# Patient Record
Sex: Female | Born: 1950
Health system: Southern US, Community
[De-identification: ages and names within clinical notes are randomized; demographics above are authoritative.]

## PROBLEM LIST (undated history)

## (undated) DIAGNOSIS — N281 Cyst of kidney, acquired: Secondary | ICD-10-CM

## (undated) DIAGNOSIS — Z860100 Personal history of colon polyps, unspecified: Secondary | ICD-10-CM

## (undated) DIAGNOSIS — E669 Obesity, unspecified: Secondary | ICD-10-CM

## (undated) DIAGNOSIS — R112 Nausea with vomiting, unspecified: Secondary | ICD-10-CM

## (undated) DIAGNOSIS — R011 Cardiac murmur, unspecified: Secondary | ICD-10-CM

## (undated) DIAGNOSIS — J939 Pneumothorax, unspecified: Secondary | ICD-10-CM

## (undated) DIAGNOSIS — I517 Cardiomegaly: Secondary | ICD-10-CM

## (undated) DIAGNOSIS — Z9889 Other specified postprocedural states: Secondary | ICD-10-CM

## (undated) DIAGNOSIS — I519 Heart disease, unspecified: Secondary | ICD-10-CM

## (undated) DIAGNOSIS — K5790 Diverticulosis of intestine, part unspecified, without perforation or abscess without bleeding: Secondary | ICD-10-CM

## (undated) DIAGNOSIS — Z8679 Personal history of other diseases of the circulatory system: Secondary | ICD-10-CM

## (undated) DIAGNOSIS — C3490 Malignant neoplasm of unspecified part of unspecified bronchus or lung: Secondary | ICD-10-CM

## (undated) DIAGNOSIS — Z972 Presence of dental prosthetic device (complete) (partial): Secondary | ICD-10-CM

## (undated) DIAGNOSIS — C541 Malignant neoplasm of endometrium: Secondary | ICD-10-CM

## (undated) DIAGNOSIS — I358 Other nonrheumatic aortic valve disorders: Secondary | ICD-10-CM

## (undated) DIAGNOSIS — Z973 Presence of spectacles and contact lenses: Secondary | ICD-10-CM

## (undated) DIAGNOSIS — M479 Spondylosis, unspecified: Secondary | ICD-10-CM

## (undated) DIAGNOSIS — I4510 Unspecified right bundle-branch block: Secondary | ICD-10-CM

## (undated) DIAGNOSIS — I700 Atherosclerosis of aorta: Secondary | ICD-10-CM

## (undated) DIAGNOSIS — I10 Essential (primary) hypertension: Secondary | ICD-10-CM

## (undated) DIAGNOSIS — R918 Other nonspecific abnormal finding of lung field: Secondary | ICD-10-CM

## (undated) DIAGNOSIS — K648 Other hemorrhoids: Secondary | ICD-10-CM

## (undated) HISTORY — DX: Malignant neoplasm of endometrium: C54.1

## (undated) HISTORY — PX: OTHER SURGICAL HISTORY: SHX169

## (undated) HISTORY — DX: Obesity, unspecified: E66.9

## (undated) HISTORY — PX: CHOLECYSTECTOMY: SHX55

## (undated) HISTORY — PX: REDUCTION MAMMAPLASTY: SUR839

## (undated) HISTORY — PX: BREAST SURGERY: SHX581

## (undated) HISTORY — PX: TONSILLECTOMY: SUR1361

---

## 1996-10-12 HISTORY — PX: BILATERAL OOPHORECTOMY: SHX1221

## 1998-10-02 ENCOUNTER — Other Ambulatory Visit: Admission: RE | Admit: 1998-10-02 | Discharge: 1998-10-02 | Payer: Self-pay | Admitting: *Deleted

## 1999-08-18 ENCOUNTER — Encounter: Admission: RE | Admit: 1999-08-18 | Discharge: 1999-08-18 | Payer: Self-pay | Admitting: Obstetrics and Gynecology

## 1999-08-18 ENCOUNTER — Encounter: Payer: Self-pay | Admitting: Obstetrics and Gynecology

## 1999-10-08 ENCOUNTER — Other Ambulatory Visit: Admission: RE | Admit: 1999-10-08 | Discharge: 1999-10-08 | Payer: Self-pay | Admitting: Obstetrics and Gynecology

## 2000-08-20 ENCOUNTER — Encounter: Admission: RE | Admit: 2000-08-20 | Discharge: 2000-08-20 | Payer: Self-pay | Admitting: Obstetrics and Gynecology

## 2000-08-20 ENCOUNTER — Encounter: Payer: Self-pay | Admitting: Obstetrics and Gynecology

## 2000-10-13 ENCOUNTER — Other Ambulatory Visit: Admission: RE | Admit: 2000-10-13 | Discharge: 2000-10-13 | Payer: Self-pay | Admitting: Obstetrics and Gynecology

## 2001-08-23 ENCOUNTER — Encounter: Admission: RE | Admit: 2001-08-23 | Discharge: 2001-08-23 | Payer: Self-pay | Admitting: Obstetrics and Gynecology

## 2001-08-23 ENCOUNTER — Encounter: Payer: Self-pay | Admitting: Obstetrics and Gynecology

## 2001-10-12 DIAGNOSIS — Z8679 Personal history of other diseases of the circulatory system: Secondary | ICD-10-CM

## 2001-10-12 HISTORY — DX: Personal history of other diseases of the circulatory system: Z86.79

## 2002-04-10 ENCOUNTER — Other Ambulatory Visit: Admission: RE | Admit: 2002-04-10 | Discharge: 2002-04-10 | Payer: Self-pay | Admitting: Obstetrics and Gynecology

## 2002-04-11 HISTORY — PX: ABDOMINAL HYSTERECTOMY: SHX81

## 2002-04-25 ENCOUNTER — Ambulatory Visit (HOSPITAL_COMMUNITY): Admission: RE | Admit: 2002-04-25 | Discharge: 2002-04-25 | Payer: Self-pay | Admitting: Obstetrics and Gynecology

## 2002-04-25 ENCOUNTER — Encounter: Payer: Self-pay | Admitting: Obstetrics and Gynecology

## 2002-05-05 ENCOUNTER — Encounter: Payer: Self-pay | Admitting: Obstetrics and Gynecology

## 2002-05-09 ENCOUNTER — Inpatient Hospital Stay (HOSPITAL_COMMUNITY): Admission: RE | Admit: 2002-05-09 | Discharge: 2002-05-11 | Payer: Self-pay | Admitting: Obstetrics and Gynecology

## 2002-05-09 ENCOUNTER — Encounter (INDEPENDENT_AMBULATORY_CARE_PROVIDER_SITE_OTHER): Payer: Self-pay

## 2002-05-09 ENCOUNTER — Encounter (INDEPENDENT_AMBULATORY_CARE_PROVIDER_SITE_OTHER): Payer: Self-pay | Admitting: Specialist

## 2002-09-04 ENCOUNTER — Encounter: Payer: Self-pay | Admitting: Obstetrics and Gynecology

## 2002-09-04 ENCOUNTER — Other Ambulatory Visit: Admission: RE | Admit: 2002-09-04 | Discharge: 2002-09-04 | Payer: Self-pay | Admitting: Obstetrics and Gynecology

## 2002-09-04 ENCOUNTER — Encounter: Admission: RE | Admit: 2002-09-04 | Discharge: 2002-09-04 | Payer: Self-pay | Admitting: Obstetrics and Gynecology

## 2002-12-05 ENCOUNTER — Other Ambulatory Visit: Admission: RE | Admit: 2002-12-05 | Discharge: 2002-12-05 | Payer: Self-pay | Admitting: Obstetrics and Gynecology

## 2003-03-08 ENCOUNTER — Other Ambulatory Visit: Admission: RE | Admit: 2003-03-08 | Discharge: 2003-03-08 | Payer: Self-pay | Admitting: Obstetrics and Gynecology

## 2003-06-08 ENCOUNTER — Other Ambulatory Visit: Admission: RE | Admit: 2003-06-08 | Discharge: 2003-06-08 | Payer: Self-pay | Admitting: Obstetrics and Gynecology

## 2003-08-03 ENCOUNTER — Encounter: Payer: Self-pay | Admitting: Obstetrics and Gynecology

## 2003-08-03 ENCOUNTER — Encounter: Admission: RE | Admit: 2003-08-03 | Discharge: 2003-08-03 | Payer: Self-pay | Admitting: Obstetrics and Gynecology

## 2003-09-11 ENCOUNTER — Encounter: Admission: RE | Admit: 2003-09-11 | Discharge: 2003-09-11 | Payer: Self-pay | Admitting: Obstetrics and Gynecology

## 2003-10-13 DIAGNOSIS — J939 Pneumothorax, unspecified: Secondary | ICD-10-CM

## 2003-10-13 HISTORY — DX: Pneumothorax, unspecified: J93.9

## 2003-12-10 ENCOUNTER — Other Ambulatory Visit: Admission: RE | Admit: 2003-12-10 | Discharge: 2003-12-10 | Payer: Self-pay | Admitting: Obstetrics and Gynecology

## 2004-07-18 ENCOUNTER — Encounter: Admission: RE | Admit: 2004-07-18 | Discharge: 2004-07-18 | Payer: Self-pay | Admitting: Obstetrics and Gynecology

## 2004-07-24 ENCOUNTER — Encounter: Admission: RE | Admit: 2004-07-24 | Discharge: 2004-07-24 | Payer: Self-pay | Admitting: Obstetrics and Gynecology

## 2004-08-12 ENCOUNTER — Encounter (INDEPENDENT_AMBULATORY_CARE_PROVIDER_SITE_OTHER): Payer: Self-pay | Admitting: *Deleted

## 2004-08-12 ENCOUNTER — Inpatient Hospital Stay (HOSPITAL_COMMUNITY): Admission: RE | Admit: 2004-08-12 | Discharge: 2004-08-16 | Payer: Self-pay | Admitting: Thoracic Surgery

## 2004-08-21 ENCOUNTER — Encounter: Admission: RE | Admit: 2004-08-21 | Discharge: 2004-08-21 | Payer: Self-pay | Admitting: Thoracic Surgery

## 2004-09-11 ENCOUNTER — Encounter: Admission: RE | Admit: 2004-09-11 | Discharge: 2004-09-11 | Payer: Self-pay | Admitting: Thoracic Surgery

## 2004-09-11 ENCOUNTER — Encounter: Admission: RE | Admit: 2004-09-11 | Discharge: 2004-09-11 | Payer: Self-pay | Admitting: Obstetrics and Gynecology

## 2004-11-11 ENCOUNTER — Encounter: Admission: RE | Admit: 2004-11-11 | Discharge: 2004-11-11 | Payer: Self-pay | Admitting: Thoracic Surgery

## 2005-01-27 ENCOUNTER — Ambulatory Visit: Admission: RE | Admit: 2005-01-27 | Discharge: 2005-01-27 | Payer: Self-pay | Admitting: Gynecology

## 2005-01-27 ENCOUNTER — Encounter (INDEPENDENT_AMBULATORY_CARE_PROVIDER_SITE_OTHER): Payer: Self-pay | Admitting: *Deleted

## 2005-02-16 ENCOUNTER — Ambulatory Visit: Admission: RE | Admit: 2005-02-16 | Discharge: 2005-04-27 | Payer: Self-pay | Admitting: Radiation Oncology

## 2005-02-17 ENCOUNTER — Ambulatory Visit (HOSPITAL_COMMUNITY): Admission: RE | Admit: 2005-02-17 | Discharge: 2005-02-17 | Payer: Self-pay | Admitting: Radiation Oncology

## 2005-02-24 ENCOUNTER — Ambulatory Visit (HOSPITAL_COMMUNITY): Admission: RE | Admit: 2005-02-24 | Discharge: 2005-02-24 | Payer: Self-pay | Admitting: Radiation Oncology

## 2005-03-03 ENCOUNTER — Ambulatory Visit (HOSPITAL_COMMUNITY): Admission: RE | Admit: 2005-03-03 | Discharge: 2005-03-03 | Payer: Self-pay | Admitting: Radiation Oncology

## 2005-03-05 ENCOUNTER — Ambulatory Visit (HOSPITAL_COMMUNITY): Admission: RE | Admit: 2005-03-05 | Discharge: 2005-03-05 | Payer: Self-pay | Admitting: Radiation Oncology

## 2005-03-17 ENCOUNTER — Ambulatory Visit (HOSPITAL_COMMUNITY): Admission: RE | Admit: 2005-03-17 | Discharge: 2005-03-17 | Payer: Self-pay | Admitting: Radiation Oncology

## 2005-07-01 ENCOUNTER — Encounter (INDEPENDENT_AMBULATORY_CARE_PROVIDER_SITE_OTHER): Payer: Self-pay | Admitting: *Deleted

## 2005-07-01 ENCOUNTER — Ambulatory Visit: Admission: RE | Admit: 2005-07-01 | Discharge: 2005-07-01 | Payer: Self-pay | Admitting: Gynecology

## 2005-07-03 ENCOUNTER — Other Ambulatory Visit: Admission: RE | Admit: 2005-07-03 | Discharge: 2005-07-03 | Payer: Self-pay | Admitting: Gynecology

## 2005-09-04 ENCOUNTER — Ambulatory Visit: Admission: RE | Admit: 2005-09-04 | Discharge: 2005-10-02 | Payer: Self-pay | Admitting: Radiation Oncology

## 2005-09-08 ENCOUNTER — Other Ambulatory Visit: Admission: RE | Admit: 2005-09-08 | Discharge: 2005-09-08 | Payer: Self-pay | Admitting: Radiation Oncology

## 2005-10-01 ENCOUNTER — Encounter: Admission: RE | Admit: 2005-10-01 | Discharge: 2005-10-01 | Payer: Self-pay | Admitting: Obstetrics and Gynecology

## 2005-11-20 ENCOUNTER — Other Ambulatory Visit: Admission: RE | Admit: 2005-11-20 | Discharge: 2005-11-20 | Payer: Self-pay | Admitting: Gynecology

## 2005-11-20 ENCOUNTER — Ambulatory Visit: Admission: RE | Admit: 2005-11-20 | Discharge: 2005-11-20 | Payer: Self-pay | Admitting: Gynecology

## 2006-01-21 ENCOUNTER — Other Ambulatory Visit: Admission: RE | Admit: 2006-01-21 | Discharge: 2006-01-21 | Payer: Self-pay | Admitting: Radiation Oncology

## 2006-01-26 ENCOUNTER — Ambulatory Visit: Admission: RE | Admit: 2006-01-26 | Discharge: 2006-01-26 | Payer: Self-pay | Admitting: Gynecologic Oncology

## 2006-01-27 ENCOUNTER — Ambulatory Visit: Admission: RE | Admit: 2006-01-27 | Discharge: 2006-01-27 | Payer: Self-pay | Admitting: Gynecology

## 2006-04-27 ENCOUNTER — Ambulatory Visit: Admission: RE | Admit: 2006-04-27 | Discharge: 2006-04-27 | Payer: Self-pay | Admitting: Gynecology

## 2006-04-27 ENCOUNTER — Encounter (INDEPENDENT_AMBULATORY_CARE_PROVIDER_SITE_OTHER): Payer: Self-pay | Admitting: *Deleted

## 2006-04-27 ENCOUNTER — Other Ambulatory Visit: Admission: RE | Admit: 2006-04-27 | Discharge: 2006-04-27 | Payer: Self-pay | Admitting: Gynecology

## 2006-07-21 ENCOUNTER — Other Ambulatory Visit: Admission: RE | Admit: 2006-07-21 | Discharge: 2006-07-21 | Payer: Self-pay | Admitting: Radiation Oncology

## 2006-10-08 ENCOUNTER — Encounter: Admission: RE | Admit: 2006-10-08 | Discharge: 2006-10-08 | Payer: Self-pay | Admitting: Gynecology

## 2006-10-27 ENCOUNTER — Encounter (INDEPENDENT_AMBULATORY_CARE_PROVIDER_SITE_OTHER): Payer: Self-pay | Admitting: *Deleted

## 2006-10-27 ENCOUNTER — Other Ambulatory Visit: Admission: RE | Admit: 2006-10-27 | Discharge: 2006-10-27 | Payer: Self-pay | Admitting: Gynecology

## 2006-10-27 ENCOUNTER — Ambulatory Visit: Admission: RE | Admit: 2006-10-27 | Discharge: 2006-10-27 | Payer: Self-pay | Admitting: Gynecology

## 2007-01-28 ENCOUNTER — Other Ambulatory Visit: Admission: RE | Admit: 2007-01-28 | Discharge: 2007-01-28 | Payer: Self-pay | Admitting: Radiation Oncology

## 2007-05-06 ENCOUNTER — Encounter: Payer: Self-pay | Admitting: Gynecology

## 2007-05-06 ENCOUNTER — Ambulatory Visit: Admission: RE | Admit: 2007-05-06 | Discharge: 2007-05-06 | Payer: Self-pay | Admitting: Gynecology

## 2007-05-06 ENCOUNTER — Other Ambulatory Visit: Admission: RE | Admit: 2007-05-06 | Discharge: 2007-05-06 | Payer: Self-pay | Admitting: Gynecology

## 2007-08-03 ENCOUNTER — Other Ambulatory Visit: Admission: RE | Admit: 2007-08-03 | Discharge: 2007-08-03 | Payer: Self-pay | Admitting: Radiation Oncology

## 2007-08-03 ENCOUNTER — Ambulatory Visit: Admission: RE | Admit: 2007-08-03 | Discharge: 2007-09-21 | Payer: Self-pay | Admitting: Radiation Oncology

## 2007-10-10 ENCOUNTER — Encounter: Admission: RE | Admit: 2007-10-10 | Discharge: 2007-10-10 | Payer: Self-pay | Admitting: Gynecology

## 2007-12-09 ENCOUNTER — Other Ambulatory Visit: Admission: RE | Admit: 2007-12-09 | Discharge: 2007-12-09 | Payer: Self-pay | Admitting: Gynecology

## 2007-12-09 ENCOUNTER — Encounter: Payer: Self-pay | Admitting: Gynecology

## 2007-12-09 ENCOUNTER — Ambulatory Visit: Admission: RE | Admit: 2007-12-09 | Discharge: 2007-12-09 | Payer: Self-pay | Admitting: Gynecology

## 2008-03-08 ENCOUNTER — Encounter: Payer: Self-pay | Admitting: Radiation Oncology

## 2008-03-08 ENCOUNTER — Ambulatory Visit: Admission: RE | Admit: 2008-03-08 | Discharge: 2008-03-08 | Payer: Self-pay | Admitting: Radiation Oncology

## 2008-03-08 ENCOUNTER — Other Ambulatory Visit: Admission: RE | Admit: 2008-03-08 | Discharge: 2008-03-08 | Payer: Self-pay | Admitting: Gynecology

## 2008-07-06 ENCOUNTER — Encounter: Payer: Self-pay | Admitting: Gynecology

## 2008-07-06 ENCOUNTER — Other Ambulatory Visit: Admission: RE | Admit: 2008-07-06 | Discharge: 2008-07-06 | Payer: Self-pay | Admitting: Gynecology

## 2008-07-06 ENCOUNTER — Ambulatory Visit: Admission: RE | Admit: 2008-07-06 | Discharge: 2008-07-06 | Payer: Self-pay | Admitting: Gynecology

## 2008-10-10 ENCOUNTER — Encounter: Admission: RE | Admit: 2008-10-10 | Discharge: 2008-10-10 | Payer: Self-pay | Admitting: Gynecology

## 2009-01-04 ENCOUNTER — Encounter: Payer: Self-pay | Admitting: Gynecology

## 2009-01-04 ENCOUNTER — Other Ambulatory Visit: Admission: RE | Admit: 2009-01-04 | Discharge: 2009-01-04 | Payer: Self-pay | Admitting: Gynecology

## 2009-01-04 ENCOUNTER — Ambulatory Visit: Admission: RE | Admit: 2009-01-04 | Discharge: 2009-01-04 | Payer: Self-pay | Admitting: Gynecology

## 2009-07-15 ENCOUNTER — Ambulatory Visit (HOSPITAL_COMMUNITY): Admission: RE | Admit: 2009-07-15 | Discharge: 2009-07-15 | Payer: Self-pay | Admitting: Internal Medicine

## 2009-07-17 ENCOUNTER — Ambulatory Visit (HOSPITAL_COMMUNITY): Admission: RE | Admit: 2009-07-17 | Discharge: 2009-07-17 | Payer: Self-pay | Admitting: Internal Medicine

## 2009-09-04 ENCOUNTER — Other Ambulatory Visit: Admission: RE | Admit: 2009-09-04 | Discharge: 2009-09-04 | Payer: Self-pay | Admitting: Gynecology

## 2009-09-04 ENCOUNTER — Ambulatory Visit: Admission: RE | Admit: 2009-09-04 | Discharge: 2009-09-04 | Payer: Self-pay | Admitting: Gynecology

## 2009-10-16 ENCOUNTER — Encounter: Admission: RE | Admit: 2009-10-16 | Discharge: 2009-10-16 | Payer: Self-pay | Admitting: Gynecology

## 2010-11-03 ENCOUNTER — Other Ambulatory Visit: Payer: Self-pay | Admitting: Hematology and Oncology

## 2010-11-03 DIAGNOSIS — Z1239 Encounter for other screening for malignant neoplasm of breast: Secondary | ICD-10-CM

## 2010-11-12 ENCOUNTER — Other Ambulatory Visit (HOSPITAL_COMMUNITY)
Admission: RE | Admit: 2010-11-12 | Discharge: 2010-11-12 | Disposition: A | Discharge status: Home / Self Care | Payer: BC Managed Care – PPO | Source: Ambulatory Visit | Attending: Gynecology | Admitting: Gynecology

## 2010-11-12 ENCOUNTER — Ambulatory Visit: Payer: BC Managed Care – PPO | Attending: Gynecology | Admitting: Gynecology

## 2010-11-12 ENCOUNTER — Ambulatory Visit
Admission: RE | Admit: 2010-11-12 | Discharge: 2010-11-12 | Disposition: A | Discharge status: Home / Self Care | Payer: BC Managed Care – PPO | Source: Ambulatory Visit | Attending: Hematology and Oncology | Admitting: Hematology and Oncology

## 2010-11-12 ENCOUNTER — Other Ambulatory Visit: Payer: Self-pay | Admitting: Gynecology

## 2010-11-12 DIAGNOSIS — E119 Type 2 diabetes mellitus without complications: Secondary | ICD-10-CM | POA: Insufficient documentation

## 2010-11-12 DIAGNOSIS — Z09 Encounter for follow-up examination after completed treatment for conditions other than malignant neoplasm: Secondary | ICD-10-CM | POA: Insufficient documentation

## 2010-11-12 DIAGNOSIS — Z90710 Acquired absence of both cervix and uterus: Secondary | ICD-10-CM | POA: Insufficient documentation

## 2010-11-12 DIAGNOSIS — Z8540 Personal history of malignant neoplasm of unspecified female genital organ: Secondary | ICD-10-CM | POA: Insufficient documentation

## 2010-11-12 DIAGNOSIS — E669 Obesity, unspecified: Secondary | ICD-10-CM | POA: Insufficient documentation

## 2010-11-12 DIAGNOSIS — C549 Malignant neoplasm of corpus uteri, unspecified: Secondary | ICD-10-CM | POA: Insufficient documentation

## 2010-11-12 DIAGNOSIS — C7800 Secondary malignant neoplasm of unspecified lung: Secondary | ICD-10-CM | POA: Insufficient documentation

## 2010-11-12 DIAGNOSIS — Z1239 Encounter for other screening for malignant neoplasm of breast: Secondary | ICD-10-CM

## 2010-12-25 ENCOUNTER — Other Ambulatory Visit (HOSPITAL_COMMUNITY): Payer: Self-pay | Admitting: Family Medicine

## 2010-12-25 ENCOUNTER — Ambulatory Visit (HOSPITAL_COMMUNITY)
Admission: RE | Admit: 2010-12-25 | Discharge: 2010-12-25 | Disposition: A | Discharge status: Home / Self Care | Payer: BC Managed Care – PPO | Source: Ambulatory Visit | Attending: Family Medicine | Admitting: Family Medicine

## 2010-12-25 ENCOUNTER — Encounter (HOSPITAL_COMMUNITY): Payer: Self-pay

## 2010-12-25 DIAGNOSIS — R1012 Left upper quadrant pain: Secondary | ICD-10-CM

## 2010-12-25 DIAGNOSIS — R0789 Other chest pain: Secondary | ICD-10-CM | POA: Insufficient documentation

## 2010-12-25 HISTORY — DX: Essential (primary) hypertension: I10

## 2011-02-11 ENCOUNTER — Other Ambulatory Visit: Payer: Self-pay | Admitting: Gynecology

## 2011-02-11 DIAGNOSIS — C541 Malignant neoplasm of endometrium: Secondary | ICD-10-CM

## 2011-02-24 NOTE — Consult Note (Signed)
[name], [name]                ACCOUNT NO.:  [id number]   MEDICAL RECORD NO.:  [id number]          PATIENT TYPE:  OUT   LOCATION:  GYN                          FACILITY:  [facility]   PHYSICIAN:  De Blanch, M.D.DATE OF BIRTH:  [birth year]   DATE OF CONSULTATION:  01/04/2009  DATE OF DISCHARGE:                                 CONSULTATION   CHIEF COMPLAINT:  Metastatic endometrial cancer.   INTERVAL HISTORY:  The patient returns today for continuing follow-up.  She saw Dr. Cleone Slim in November.  Subsequently she has done very well.  She  has been on a weight-reducing diet and has in fact lost 28 pounds.  She  continues to work full time.  She has known pulmonary metastasis which  has had a complete response to Megace.  She continues to take Megace  without any difficulty.  She specifically denies any pulmonary symptoms  of cough, chest pain, hemoptysis, or sputum production.  She denies any  abdominal or pelvic pain.  She denies any GU or GI symptoms. Her only  symptoms are that of a yeast vulvitis. She has taken Diflucan recently  with only modest results.   HISTORY OF PRESENT ILLNESS:  Stage Ib grade 1 endometrial cancer  undergoing initial surgery approximately 5 years ago. She subsequently  developed pulmonary nodules and a vaginal vault recurrence.  The vault  recurrence was treated with radiation therapy.  Pulmonary nodule was  biopsied with a VATS procedure and found to be metastatic endometrial  carcinoma.  The patient was initially treated with 6 cycles of  carboplatin and Taxol with a partial response.  She subsequently has  been receiving Megace 40 mg t.i.d. and has had a complete response based  on chest CT scan.  Another CT scan is scheduled for May of 2010.   PAST MEDICAL HISTORY:  Medical illnesses:  Obesity, diabetes.   PAST SURGICAL HISTORY:  Cesarean section. TAH-BSO. Cholecystectomy. VATS  procedure.   CURRENT MEDICATIONS:  Lorazepam, amitriptyline,  metformin, Actos.   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 She does not smoke.   FAMILY HISTORY:  Mother has ovarian cancer.   REVIEW OF SYSTEMS:  A 10-point comprehensive review of systems was  negative except as noted above.   PHYSICAL EXAMINATION:  VITAL SIGNS:  Weight 231 pounds (down from 259  pounds).  GENERAL: The patient is a healthy white female in no acute distress.  HEENT:  Negative.  NECK: Supple without thyromegaly.  LYMPHATICS: There is no supraclavicular or inguinal adenopathy.  ABDOMEN: Soft and nontender. No mass, organomegaly, ascites, or hernias  noted.  PELVIC:  BUS shows some yeast vulvitis.  The vagina is atrophic.  No  lesions are noted.  Uterus and cervix are surgically absent.  Bladder  and urethra are normal.  Bimanual and rectovaginal exams are normal.   IMPRESSION:  Metastatic endometrial cancer, currently with no evidence  of disease.  The patient will continue taking Megace.  She will see Dr.  Cleone Slim in May and have a CT scan for reassessment  at that time.   She will return to see me in November 2010.  A Pap smear is obtained  today.      De Blanch, M.D.  Electronically Signed     DC/MEDQ  D:  01/04/2009  T:  01/04/2009  Job:  [id number]   cc:   Telford Nab, R.N.  [address]  [place], [place] [address]   Lynett Fish, M.D.

## 2011-02-24 NOTE — Consult Note (Signed)
[name], [name]                ACCOUNT NO.:  [id number]   MEDICAL RECORD NO.:  [id number]          PATIENT TYPE:  OUT   LOCATION:  GYN                          [facility]ACILITY:  [facility]   PHYSICIAN:  De Blanch, M.D.DATE OF BIRTH:  1951-09-11   DATE OF CONSULTATION:  05/06/2007  DATE OF DISCHARGE:                                 CONSULTATION   CHIEF COMPLAINT:  Endometrial cancer.   INTERVAL HISTORY:  The patient returns today for continuing followup.  She saw Dr. Cleone Slim last in June and Dr. Roselind Messier in March.  Since then she  has done well.  She specifically denies any pulmonary symptoms.  She  continues to work full time.  She denies any GI or GU symptoms, has no  pelvic pain, pressure, vaginal bleeding or discharge.  Functional status  is excellent.  She continues to take Megace 40 mg t.i.d.  She is scheduled to have a CT scan of the chest, abdomen, and pelvis  again in September which is arranged through Dr. Bertha Stakes office.  Since  her last visit, she has had no new symptoms.   HISTORY OF PRESENT ILLNESS:  The patient underwent initial surgery for  endometrial cancer approximately 4 years ago.  She was found to have a  stage IB grade 1 cancer.  She did have some lymph vascular space  involvement but no other high risk features.  She was followed but  developed pulmonary nodules which were confirmed by a VATS procedure as  metastatic endometrial cancer.  She also at that time had a vaginal  vault recurrence.  The vaginal disease was treated with brachytherapy  quite successfully.  She was then treated with 6 cycles of carboplatin  and Taxol.  She had a response but had persistent disease.  Subsequently, she was placed on Megace and has had a good partial  response to date and remains asymptomatic.  She also tolerates Megace  well.   PAST MEDICAL HISTORY:  Medical illnesses:  1. Obesity.  2. Diabetes.   PAST SURGICAL HISTORY:  1. Cesarean section.  2. TAH-BSO.  3.  Cholecystectomy.  4. VATS procedure.   CURRENT MEDICATIONS:  Lorazepam, metformin, Actos, Neurontin.   SOCIAL HISTORY:  The patient is a Occupational psychologist for  [employer].  She lives in [place] and does not smoke.   FAMILY HISTORY:  Mother with ovarian cancer.   REVIEW OF SYSTEMS:  A 10-point comprehensive review of systems is  negative except as noted above.   PHYSICAL EXAMINATION:  VITAL SIGNS:  Weight 257 pounds (stable).  GENERAL:  The patient is an obese white female in no acute distress.  HEENT:  Negative.  NECK:  Supple without thyromegaly.  There is no supraclavicular or  inguinal adenopathy.  ABDOMEN:  Obese, soft, nontender, no mass, organomegaly, ascites, or  hernias are noted.  PELVIC:  EG/BUS, vagina, bladder, urethra are normal.  Cervix and uterus  are surgically absent.  No lesions are noted.  Adnexa without masses.  Rectovaginal exam confirms.   IMPRESSION:  Recurrent endometrial cancer with pulmonary metastases  which are entirely asymptomatic.  The patient is tolerating Megace very well.  She is given a new  prescription for Megace 40 mg t.i.d.  She will return to see Dr. Cleone Slim in  September after followup CT scans.  I will plan on seeing the patient  back in 6 months or as needed.      De Blanch, M.D.  Electronically Signed     DC/MEDQ  D:  05/06/2007  T:  05/06/2007  Job:  [id number]   cc:   Lynett Fish, M.D.   Billie Lade, M.D.  Fax: [phone]   Telford Nab, R.N.  [address]  [place], [place] [address]   Lenoard Aden, M.D.  Fax: [phone]

## 2011-02-24 NOTE — Consult Note (Signed)
[name], DAUPHIN                ACCOUNT NO.:  [id number]   MEDICAL RECORD NO.:  [id number]          PATIENT TYPE:  OUT   LOCATION:  GYN                          FACILITY:  [facility]   PHYSICIAN:  De Blanch, M.D.DATE OF BIRTH:  [birth year]   DATE OF CONSULTATION:  07/06/2008  DATE OF DISCHARGE:                                 CONSULTATION   CHIEF COMPLAINT:  Recurrent endometrial cancer.   INTERVAL HISTORY:  The patient returns today for continuing followup.  She has alternated visits between Dr. Cleone Slim, Dr. Roselind Messier and myself. In  May of 2009, she had a CT scan of the chest which showed no evidence of  disease.   In the interval, the patient has done well. She denies any GI or GU  symptoms. Has no pelvic pain, pressure, vaginal bleeding or discharge.  Functional status is very good, and she is working full time. She denies  any pulmonary symptoms and denies any pelvic pain, pressure, vaginal  bleeding or discharge.   HISTORY OF PRESENT ILLNESS:  The patient underwent surgery approximately  five years for stage Ib grade 1 endometrial cancer. Only significant  high-risk feature was some lymph vascular space involvement. She was  followed but developed pulmonary nodules which after excision by VATS  procedure were found to be metastatic endometrial cancer. She also had a  vaginal vault recurrence at the same time. Vaginal vault recurrence was  treated with radiation therapy. She then received 6 cycles of  carboplatin and Taxol chemotherapy. She had a partial response but  persistent disease in the lungs and was then placed on Megace 40 mg  t.i.d. She has had complete response to Megace, and she continues to  take Megace.   PAST MEDICAL HISTORY:  Medical illnesses:  Obesity and diabetes.   PAST SURGICAL HISTORY:  1. Cesarean section.  2. TAH-BSO.  3. Cholecystectomy.  4. VATS procedure.   CURRENT MEDICATIONS:  1. Lorazepam p.r.n.  2. Amitriptyline.  3. Metformin.  4.  Actos.   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   FAMILY HISTORY:  Mother with ovarian cancer.   REVIEW OF SYSTEMS:  Ten-point comprehensive review of systems negative  except as noted above.   PHYSICAL EXAMINATION:  Weight 259 pounds (stable).  GENERAL:  The patient is a pleasant, healthy, obese, white female in no  acute distress.  HEENT:  Is negative.  NECK:  Supple without thyromegaly. There is supraclavicular or inguinal  adenopathy.  ABDOMEN:  Soft, nontender. No masses, organomegaly, ascites, or hernia  noted. Her panniculus has some erythema beneath it.  PELVIC EXAM:  EGBUS, vagina, bladder, urethra are normal. Cervix and  uterus surgically absent. There are no lesions in the vagina. Bimanual  exam reveals no masses, induration or nodularity.   IMPRESSION:  Recurrent endometrial cancer, now having had a complete  response. Pap smears were obtained. She will return to see Dr. Cleone Slim as  previously scheduled in November. I will plan on seeing the patient  again in February, and she will  be scheduled by Dr. Cleone Slim to have a CT  scan in May for annual followup. In the interval, she will continue  taking Megace.      De Blanch, M.D.  Electronically Signed     DC/MEDQ  D:  07/06/2008  T:  07/06/2008  Job:  [id number]   cc:   Lynett Fish, M.D.   Billie Lade, M.D.  Fax: [phone]   Lenoard Aden, M.D.  Fax: [phone]   Telford Nab, R.N.  [address]  [place], [place] [address]

## 2011-02-24 NOTE — Consult Note (Signed)
[name], [name]                ACCOUNT NO.:  [id number]   MEDICAL RECORD NO.:  [id number]          PATIENT TYPE:  OUT   LOCATION:  GYN                          FACILITY:  [facility]   PHYSICIAN:  De Blanch, M.D.DATE OF BIRTH:  [birth year]   DATE OF CONSULTATION:  12/09/2007  DATE OF DISCHARGE:                                 CONSULTATION   CHIEF COMPLAINT:  Recurrent endometrial cancer.   INTERVAL HISTORY:  The patient returns today for continuing followup.  She has known pulmonary metastases from her endometrial cancer and is  taking Megace 40 mg t.i.d.  She is having an excellent response.  She  has been followed by Dr. Cleone Slim with surveillance CT scans which have  shown continued response to Megace therapy.  The patient herself has no  pulmonary symptoms.  She also has no GI or GU symptoms, has no pelvic  pain, pressure, vaginal bleeding or discharge.   HISTORY OF PRESENT ILLNESS:  The patient underwent surgery approximately  4 years ago for a stage IB grade 1 endometrial cancer.  She had some  lymph vascular space involvement but no other high risk features.  She  was followed but developed pulmonary nodules confirmed by a VATS  procedure as metastatic endometrial cancer.  At the same time she also  had a vaginal vault recurrence.  She received pelvic radiation therapy  with successful response and then received 6 cycles of carboplatin and  Taxol chemotherapy.  She had a partial response but persistent disease.  She was then placed on Megace and has had a good response to date.  Her  next CT scan is apparently scheduled for May of 2009.   PAST MEDICAL HISTORY:  Medical illnesses - obesity and diabetes.   PAST SURGICAL HISTORY:  Cesarean section, TAH-BSO, cholecystectomy and a  VATS procedure.   CURRENT MEDICATIONS:  Amitriptyline, lorazepam, metformin, Actos and  Neurontin.   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   FAMILY HISTORY:  Mother with ovarian cancer.   REVIEW OF SYSTEMS:  A 10 point comprehensive review of systems negative  except as noted above.   PHYSICAL EXAMINATION:  VITAL SIGNS:  Weight 257 pounds, blood pressure  130/70, pulse 80.  GENERAL:  The patient is a healthy, obese white female no acute  distress.  HEENT:  Is negative.  NECK:  Supple without thyromegaly.  There is no supraclavicular or  inguinal adenopathy.  ABDOMEN:  The abdomen is obese, soft, nontender.  No mass, organomegaly,  ascites or hernias are noted.  PELVIC:  EG, BUS, vagina, urethra are normal.  Cervix and uterus are  surgically absent.  No lesions are noted.  Bimanual rectovaginal exam  reveal no masses, induration or nodularity.   Pap smears were obtained.   IMPRESSION:  Recurrent endometrial cancer continuing to respond to  Megace therapy.  She will continue taking Megace 40 mg t.i.d.  She is  scheduled to have a CT scan in May and to return to see Dr. Cleone Slim about  that time.  We will have her return to see me in September of 2009.      De Blanch, M.D.  Electronically Signed     DC/MEDQ  D:  12/09/2007  T:  12/10/2007  Job:  [id number]   cc:   Telford Nab, R.N.  [address]  [place], [place] [address]   Lynett Fish, M.D.   Billie Lade, M.D.  Fax: [phone]   Lenoard Aden, M.D.  Fax: [phone]

## 2011-02-27 ENCOUNTER — Ambulatory Visit (HOSPITAL_COMMUNITY)
Admission: RE | Admit: 2011-02-27 | Discharge: 2011-02-27 | Disposition: A | Discharge status: Home / Self Care | Payer: BC Managed Care – PPO | Source: Ambulatory Visit | Attending: Gynecology | Admitting: Gynecology

## 2011-02-27 ENCOUNTER — Ambulatory Visit (HOSPITAL_COMMUNITY): Payer: BC Managed Care – PPO

## 2011-02-27 DIAGNOSIS — C549 Malignant neoplasm of corpus uteri, unspecified: Secondary | ICD-10-CM | POA: Insufficient documentation

## 2011-02-27 DIAGNOSIS — J984 Other disorders of lung: Secondary | ICD-10-CM | POA: Insufficient documentation

## 2011-02-27 DIAGNOSIS — C541 Malignant neoplasm of endometrium: Secondary | ICD-10-CM

## 2011-02-27 MED ORDER — IOHEXOL 300 MG/ML  SOLN
80.0000 mL | Freq: Once | INTRAMUSCULAR | Status: AC | PRN
  Administered 2011-02-27: 80 mL via INTRAVENOUS

## 2011-02-27 NOTE — H&P (Signed)
NAMESIRIAH, TREAT                ACCOUNT NO.:  [id number]   MEDICAL RECORD NO.:  [id number]          PATIENT TYPE:  INP   LOCATION:  NA                           FACILITY:  [facility]   PHYSICIAN:  Ines Bloomer, M.D. DATE OF BIRTH:  09/18/1951   DATE OF ADMISSION:  DATE OF DISCHARGE:                                HISTORY & PHYSICAL   DATE OF ADMISSION:  August 12, 2004   DATE OF HISTORY AND PHYSICAL:  August 08, 2004   REASON FOR ADMISSION:  Bilateral lung lesions.   HISTORY OF PRESENT ILLNESS:  Ms. Even is a pleasant 60 year old female who  is referred to Dr. Edwyna Shell by Dr. Isabel Caprice for evaluation of bilateral  pulmonary nodules.  The patient has a history of uterine cancer status post  total abdominal hysterectomy and oophorectomy in 2003.  The patient was  recently found to have bilateral lung nodules on CT scan of the chest.  This  showed multiple small, less than 1 cm, bilateral nodules consistent with  pulmonary metastases.  Dr. Isabel Caprice then referred the patient to Dr.  Edwyna Shell for further evaluation and possible surgical intervention.  Dr.  Edwyna Shell saw and examined the patient in consultation on August 05, 2004.  He  felt that the patient's best option would be to proceed with a VATS  resection of the nodules for definitive diagnosis.  If there is metastatic  disease, then the patient will require chemotherapy and Dr. Edwyna Shell will  proceed with a Port-A-Cath placement at the same time of the thoracic  procedure.  The risks, benefits, and alternatives to the procedure were  discussed with the patient at that time.  The patient was in understanding  and agreed to proceed as planned.  The patient presents today for a routine  preoperative history and physical exam.   Currently, the patient denies any fevers, chills, night sweats, or cough or  cold-like symptoms.  She denies any unexplained weight loss, GERD-like  symptoms, shortness of breath, dyspnea on exertion,  PND, angina,  arrhythmias, peripheral edema, hemoptysis, symptoms of TIA/CVA, history of  pneumonia, or history of PE/DVT.   PAST MEDICAL HISTORY:  1.  Borderline diabetes mellitus.  (The patient has taken Glucophage in the      past which caused hypoglycemia and this was stopped.)  2.  History of stage 1 endometrial cancer, status post total abdominal      hysterectomy and oophorectomy, now with recurrence to the vaginal vault      found on exam on July 31, 2004.   PAST SURGICAL HISTORY:  1.  Cholecystectomy in 2000.  2.  Tonsillectomy as a child.  3.  Bilateral breast reduction in 1983.  4.  Total abdominal hysterectomy and oophorectomy in 2003.  5.  History of two C-sections.   CURRENT MEDICATIONS:  1.  Vitamin C 500 mg daily.  2.  Multivitamin daily.  3.  Fish oil tablets daily.  4.  Acidophilus tablets daily.  5.  Calcium 600 mg daily.   ALLERGIES:  COMPAZINE causes neck swelling.   REVIEW OF SYSTEMS:  Please see HPI for pertinent positives and negatives;  otherwise, as follows.  The patient does wear contacts for most of the day.  She otherwise denies any changes in her bowel or bladder habits, or bowel or  bladder patterns.  She denies any hematuria, hematochezia, dysuria, urgency,  or frequency.  She denies any heart palpitations, chest pain, or chest  tightness.  The remainder of the 12-point review of systems is negative.   FAMILY HISTORY:  The patient's mother had history of breast cancer and  ovarian cancer and died at age 22.  The patient's father had history of  coronary artery disease status post coronary artery bypass grafting.   SOCIAL HISTORY:  The patient is divorced and lives in [place], [place].  She does currently have a boyfriend.  She has two children, a 79-  year-old son and a 71 year old son.  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  The patient denies  any alcohol use.  The patient  denies any tobacco use.   PHYSICAL EXAMINATION:  VITAL SIGNS:  Blood pressure is 142/80 on the left  arm, pulse is 75 and regular, respirations are 16 and unlabored, height is 5  feet 5 inches and weight is 230 pounds.  GENERAL:  This is a pleasant 60 year old white female who is somewhat  anxious during this visit.  She is in no acute distress and is alert and  oriented x3.  HEENT:  [place]/AT.  PERRLA.  EOMI.  NECK:  Supple without JVD, bruit, or lymphadenopathy.  CHEST:  Symmetrical on inspiration and lung sounds are clear to auscultation  throughout without wheezes, rhonchi, or rales.  CARDIAC:  Regular rate and rhythm without murmur, gallop, or rub.  ABDOMEN:  Soft, nontender, nondistended with good bowel sounds in all four  quadrants.  The patient does have multiple abdominal scars including a  transverse lower abdomen scar from hysterectomy and C-sections.  GENITOURINARY/RECTAL:  Deferred.  EXTREMITIES:  Warm and dry without clubbing, cyanosis, or edema.  PERIPHERAL PULSES:  As follows:  2+ carotid pulses bilaterally, 2+ femoral  pulses bilaterally, 2+ dorsalis pedis pulses bilaterally, 2+ posterior  tibial pulses bilaterally.  NEUROLOGIC:  Cranial nerves II-XII are intact.  Her gait is steady.  Her  muscle strength is equal and appropriate throughout.   ASSESSMENT AND PLAN:  This is a pleasant 60 year old female with bilateral  lung nodules and a history of uterine cancer.  We will continue as planned  with a left VATS and lung biopsy and resection of lung nodules for  diagnosis on August 12, 2004.  Dr. Edwyna Shell has seen and evaluated this  patient prior to this admission and has explained the risks and benefits of  the procedure.  The patient is an understanding and has agreed to continue  as planned.       CAF/MEDQ  D:  08/08/2004  T:  08/08/2004  Job:  [id number]   cc:   Lynett Fish, M.D.  [address] [place]  [place] [address]  Fax: [phone]

## 2011-02-27 NOTE — Op Note (Signed)
NAME:  [name], [name]                          ACCOUNT NO.:  [id number]   MEDICAL RECORD NO.:  [id number]                   PATIENT TYPE:  INP   LOCATION:  9304                                 FACILITY:  [facility]   PHYSICIAN:  Lenoard Aden, M.D.             DATE OF BIRTH:  07/02/1951   DATE OF PROCEDURE:  DATE OF DISCHARGE:                                 OPERATIVE REPORT   PREOPERATIVE DIAGNOSES:  Endometrial adenocarcinoma stage 1   POSTOPERATIVE DIAGNOSES:  1. Endometrial adenocarcinoma, stage 1  2. Pelvic adhesive disease   PROCEDURE:  1. Exploratory laparotomy  2. Total abdominal hysterectomy  3. Lysis of adhesions   SURGEON:  Lenoard Aden, M.D.   ASSISTANT:  Pershing Cox, M.D.   ANESTHESIA:  General   ESTIMATED BLOOD LOSS:  500 cc   URINE OUTPUT:  100 cc   FLUID INTAKE:  1000 cc   COMPLICATIONS:  None. The patient recovering guarded condition.   BRIEF OPERATIVE NOTE:  After being apprised of the risks of anesthesia,  infection, bleeding, inability to treat cancer, possible need for further  therapy, the patient was brought to the operating room where she was  administered general anesthetic without complications, prepped and draped in  the usual sterile fashion, Foley catheter placed. After achieving adequate  anesthesia, a dilute Marcaine solution was placed along an area of the  modified Pfannenstiel incision which is made due to the patient's large  paniculus. A large Pfannenstiel skin incision is made about 2-3  fingerbreadths above the crease of the paniculus, carried down the fascia  which was nicked in the midline and opened transversely with Mayo scissors.  Rectus muscles dissected sharply in the midline, peritoneum entered sharply.  Omental adhesions to the anterior abdominal wall are lysed sharply, good  hemostasis is noted. There are thick adhesions from the uterus and the lower  uterine segment to the anterior abdominal wall which I  lysed using sharp  dissection and adhesions of the sigmoid colon to the posterior wall of the  uterus which are lysed using blunt dissection. Good hemostasis noted, no  evidence of bowel injury noted. The round ligament is identified on the  left, suture ligated and tied. The uterine vessels skeletonized and suture  ligated, blunt flap developed sharply. The right round ligament is tied and  suture ligated. The vessels are isolated and ligated using two sutures on  the right and left of #0 Vicryl suture. The adhesions are further lysed off  the lower uterine segment, bladder flap is easily developed, extensive  dissection down the broad and cardinal ligament is done using long  instruments and meticulous dissection due to the patient's morbid obesity  and long cervix. The cervicovaginal junction is reached and the junction is  entered sharply and specimen is removed. Vagina is closed using #0 Vicryl  pop-offs. Good hemostasis is noted. During this point in the  case after  adequate irrigation, it is noted that the vagina is hemostatic. However,  there is approximately 100 cc of urine output during the case. At this time,  the bladder is still retrograde with an indigo carmine stained lactated  Ringer solution 500 cc placed total. Good bladder distention is noted. The  Foley bulb is palpable.  The ureters have been identified. At the end of the  case, no evidence of ureteral trauma, normal bladder filling, no evidence of  bladder leakage. At this time, the bladder is emptied, the fascia then  closed using a #0 Vicryl in continuous running fashion. A deep stitch of #0  plain is placed in the subcutaneous tissue, skin is closed using staples.  The patient tolerated the procedure well and is transferred to recovery in  good condition.                                               Lenoard Aden, M.D.    RJT/MEDQ  D:  05/09/2002  T:  05/11/2002  Job:  [id number]   cc:   [facility]

## 2011-02-27 NOTE — Consult Note (Signed)
[name], [name]                ACCOUNT NO.:  [id number]   MEDICAL RECORD NO.:  [id number]          PATIENT TYPE:  OUT   LOCATION:  GYN                          FACILITY:  [facility]   PHYSICIAN:  De Blanch, M.D.DATE OF BIRTH:  1950/10/14   DATE OF CONSULTATION:  DATE OF DISCHARGE:                                 CONSULTATION   CHIEF COMPLAINT:  Metastatic endometrial cancer.   INTERVAL HISTORY:  Since the patient's last visit, she has done well.  She has known pulmonary metastasis and has been taking Megace 40 mg  t.i.d.  She has no pulmonary symptoms and specifically denies any  shortness of breath, cough, or hemoptysis.  Overall, her functional  status is good.  She has no GI or GU symptoms.  Denies any pelvic pain,  pressure, vaginal bleeding, or discharge.  The patient recently saw Dr.  Cleone Slim, who is planning on performing a CT scan in March to assess her  disease status.   HISTORY OF PRESENT ILLNESS:  Patient underwent initial surgery for  endometrial cancer approximately three years ago.  She was found to have  a stage IB, grade I endometrial cancer.  She did have lymph vascular  space involvement with no other high risk features.  She is followed but  developed pulmonary nodules, which were confirmed by a VATS procedure,  as metastatic endometrial cancer.  She also had a vaginal vault  recurrence.  The vaginal vault recurrence was treated with  brachytherapy.  She subsequently was treated with six cycles of  carboplatin and Taxol chemotherapy.  At the completion at that time, she  had persistent disease and was subsequently placed on Megace, having had  a good response.   PAST MEDICAL HISTORY:  Diabetes, obesity.   PAST SURGICAL HISTORY:  Cesarean section, TAH/BSO, cholecystectomy, and  VATS procedure.   CURRENT MEDICATIONS:  Lorazepam p.r.n., Metformin, Actos, Neurontin.   SOCIAL HISTORY:  Patient is a Occupational psychologist for [employer].  She lives  in [place].  She does not smoke.   FAMILY HISTORY:  Mother with ovarian cancer.   REVIEW OF SYSTEMS:  A 10-point comprehensive review of systems is  negative, except as noted above.   PHYSICAL EXAMINATION:  VITAL SIGNS:  Weight 257 pounds (up 14 pounds  from July, 2007).  Blood pressure 160/80, pulse 80, respiratory rate 20.  GENERAL:  The patient is an obese white female in no acute distress.  HEENT:  Negative.  NECK:  Supple without thyromegaly.  There is no supraclavicular or  inguinal adenopathy.  ABDOMEN:  Obese, soft and nontender.  No mass, organomegaly, ascites, or  hernias are noted.  PELVIC:  EG/BUS, vagina, bladder, and urethra are normal.  The vaginal  cuff has no lesions.  It is somewhat atrophic.  On bimanual exam, there  is no vaginal nodularity.  There are no pelvic masses.  Rectovaginal  exam confirms.  EXTREMITIES:  Lower extremities reveal 1+ ankle edema bilaterally.   IMPRESSION:  Metastatic endometrial carcinoma, doing well on Megace.  The patient is scheduled to have a follow-up CT scan in March, which  is  ordered by Dr. Cleone Slim, and we will await the results of that study.  In  the interim, she will continue taking Megace.  In terms of followup, she  will see Dr. Cleone Slim in March.  Dr. Roselind Messier is scheduled to see her in  April, and I will plan on seeing her again in July, 2008.      De Blanch, M.D.  Electronically Signed     DC/MEDQ  D:  10/28/2006  T:  10/28/2006  Job:  [id number]   cc:   Telford Nab, R.N.  [address]  [place], [place] [address]   Lynett Fish, M.D.   Billie Lade, M.D.  Fax: [phone]   Lenoard Aden, M.D.  Fax: [phone]

## 2011-02-27 NOTE — Op Note (Signed)
[name], [name]                ACCOUNT NO.:  [id number]   MEDICAL RECORD NO.:  [id number]          PATIENT TYPE:  INP   LOCATION:  2899                         FACILITY:  [facility]   PHYSICIAN:  Ines Bloomer, M.D. DATE OF BIRTH:  11/11/1950   DATE OF PROCEDURE:  DATE OF DISCHARGE:                                 OPERATIVE REPORT   PREOPERATIVE DIAGNOSIS:  Bilateral pulmonary nodules, possibly recurrent  endometrial cancer.   POSTOPERATIVE DIAGNOSIS:  Recurrent endometrial cancer.   OPERATION:  Right VATS, minithoracotomy wedge resection x3. Insertion of  left subclavian Port-A-Cath.   SURGEON:  Ines Bloomer, M.D.   FIRST ASSISTANT:  Coral Ceo, P.A.C.   After general anesthesia, the patient was turned in the left lateral  thoracotomy position, was prepped and draped in the usual sterile manner.  A  dual lumen tube was inserted and two trocar sites were inserted in the  seventh and eighth right intercostal space at the anterior axillary line and  the posterior axillary line.  A 30 degree scope was inserted and the lesions  could be seen on the right lower lobe and on the right middle lobe.  Pictures were taken but we were unable to do this with a complete VATS  resection so a small thoracotomy was made over the sixth intercostal space  with the latissimus being partially divided and the serratus split and a  small Tuffier inserted and we grasped the lesion in the middle lobe with a  Duval lung clamp and then stapled and resected it with two applications of  the EZ 45 stapler.  In like manner, lesion in the superior segment of the  right lower lobe was grasped and stapled with the EZ 45 stapler to resect it  and third one in the right upper lobe anterior segment was grasped with  [facility] lung clamp, stapled and divided with EZ 45 stapler. CoSeal was applied  to the staple lines.  Two chest tubes were brought in through the trocar  sites. Chest was closed with #2 chromic  pericostals.  The on-cue catheters  were placed between the pericostals.  The muscle was closed with #1 Vicryl,  subcutaneous tissue with 2-0 Vicryl and Dermabond for the skin.  Frozen  section revealed metastatic endometrial cancer.  The patient was turned to  the supine position and prepped and draped in the usual sterile manner.  A  small incision was made in the infraclavicular area and a left subclavian  puncture was performed and a guidewire threaded through the needle to the  right atrium under fluoroscopy.  Over the guidewire was passed a dilator,  peel-away sheath.  The dilator and guidewire removed and the Port-A-Cath  tube was passed through the peel-away sheath to the right atrial SVC  junction.  After this had been done, the Port-A-Cath was then tunneled  retrograde using a Sims tendon forceps down to a Port-A-Cath reservoir site.  This site had been dissected out previously with a transverse incision.  The  Port-A-  Cath was then placed in the reservoir and sutured in place with  3-0 silk.  The wounds were closed with 3-0 Vicryl in the subcutaneous tissue and  Dermabond for the skin.  The Port-A-Cath flushed and withdrew easily and was  left cannulated.  The patient was returned to the recovery room in stable  condition.       DPB/MEDQ  D:  08/12/2004  T:  08/12/2004  Job:  [id number]   cc:   Lynett Fish, M.D.  [address] [place]  [place] [address]  Fax: [phone]

## 2011-02-27 NOTE — Consult Note (Signed)
**Note Kathleen-Identified via Obfuscation** NAMECARNELLA, [name]                ACCOUNT NO.:  [id number]   MEDICAL RECORD NO.:  [id number]          PATIENT TYPE:  OUT   LOCATION:  GYN                          FACILITY:  [facility]   PHYSICIAN:  [name], M.D.DATE OF BIRTH:  08-19-1951   DATE OF CONSULTATION:  DATE OF DISCHARGE:                                   CONSULTATION   REASON FOR CONSULTATION:  [name] returns today as instructed because of a  vulvar infection seen by [name] yesterday.  Because of the patient's  obesity and diabetes, she is at high risk to develop necrotizing fasciitis,  and close observation has been recommended.  The patient reports that since  yesterday, her vulva feels much better and the skin is much less tight.  The drainage from an open area is much less as well.  The patient has been  on Levaquin 500 mg daily for the last six days.  She denies any fever or  chills.   The patient is a known diabetic but apparently has not taken any anti-  diabetic medications.  It has been recommended that she see her primary care  physician in the near future for reassessment.   HISTORY OF PRESENT ILLNESS:  The patient underwent initial surgery for  endometrial cancer approximately two years ago, with the findings of a stage  1B, grade 1 tumor.  She had lymphvascular space involvement with no other  high risk features and was followed.  Subsequently, she developed pulmonary  nodules which were confirmed by VATS procedure performed by [name].  This revealed metastatic endometrial cancer.  She also had a vaginal vault  recurrence.  The vaginal vault recurrence was treated with radiation  therapy, and she received six cycles of carboplatin and Taxol chemotherapy.  At that juncture, she had persistent disease, but an observation program was  recommended by her primary medical oncologist, Dr. Margo [name].   PAST MEDICAL HISTORY:  1.  Obesity.  2.  Diabetes.   PAST SURGICAL HISTORY:  1.  Cesarean  section.  2.  TAH/BSO.  3.  Cholecystectomy.  4.  VATS procedure.   CURRENT MEDICATIONS:  1.  Folic acid.  2.  Potassium.  3.  Lorazepam.  4.  Ambien.  5.  Neurontin.  6.  Levaquin.   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.  She does not smoke.   FAMILY HISTORY:  Mother with ovarian cancer.   REVIEW OF SYSTEMS:  10-point comprehensive review of systems is negative,  except for symptoms noted above.   PHYSICAL EXAMINATION:  VITAL SIGNS:  Weight 224 pounds.  GENERAL:  The patient is a healthy white female in no acute distress.  HEENT:  Negative.  NECK:  Supple without thyromegaly.  There is no supraclavicular or inguinal  adenopathy.  ABDOMEN:  Obese, soft, nontender.  No masses, organomegaly, or hernias  noted.  PELVIC:  EGBUS shows erythema of the right labia majora.  There is an open  area which is draining minimally.  Palpation reveals firmness without out  any fluctuance.  She also has a considerable amount of yeast cutaneous  changes in her intertriginous folds of her lower abdomen and groin.  The  left vulva shows a yeast infection.  There were no additional skin changes  and no paresthesias or blistering.   IMPRESSION:  Vulvar cellulitis.  The patient will continue on Levaquin.  She  was also given a prescription for Flagyl to cover potential anaerobic  bacteria.  She is specifically instructed regarding signs and symptoms of  necrotizing fasciitis, and she will contact us or go to the emergency room  if her symptoms worsen.  She had her blood sugar checked today, and it was  368 mg/dl.  She is strongly encouraged to return to see her primary care  physician as soon as possible, as diabetic control is important in the care  of her vulvar abscess.  All of her questions were answered.  She will return  to see [place] in approximately one week for reevaluation.      [name], M.D.   Electronically Signed     DC/MEDQ  D:  01/27/2006  T:  01/28/2006  Job:  [id number]   cc:   [name], M.D.  Fax: [phone]   [name], M.D.   [name], M.D.  Fax: [phone]   [name]

## 2011-02-27 NOTE — Discharge Summary (Signed)
   NAME:  KEESHA, PELLUM                          ACCOUNT NO.:  [id number]   MEDICAL RECORD NO.:  [id number]                   PATIENT TYPE:  INP   LOCATION:  9304                                 FACILITY:  [facility]   PHYSICIAN:  Lenoard Aden, M.D.             DATE OF BIRTH:  01/31/1951   DATE OF ADMISSION:  05/09/2002  DATE OF DISCHARGE:  05/11/2002                                 DISCHARGE SUMMARY   HOSPITAL COURSE:  The patient underwent exploratory laparotomy, TAH, lysis  of adhesions on May 09, 2002.  Postoperative course uncomplicated,  tolerated regular diet well.  Hemoglobin 11.9 on postoperative day #1, to  follow up.  Discharged to home postoperative day #2.   DISPOSITION:  1. Discharge teaching done.  2. Tylox #30 given.  3. Incisional care discussed.  4. Follow up in the office within one week for staple removal.                                               Lenoard Aden, M.D.    RJT/MEDQ  D:  07/02/2002  T:  07/04/2002  Job:  [id number]

## 2011-02-27 NOTE — Consult Note (Signed)
[name], [name]                ACCOUNT NO.:  [id number]   MEDICAL RECORD NO.:  [id number]          PATIENT TYPE:  OUT   LOCATION:  GYN                          FACILITY:  [facility]   PHYSICIAN:  De Blanch, M.D.DATE OF BIRTH:  1951/02/14   DATE OF CONSULTATION:  01/27/2005  DATE OF DISCHARGE:                                   CONSULTATION   A 60 year old white female seen in consultation at the request of Dr. Antony Blackbird to evaluate a vaginal nodule.   Approximately two years ago, the patient underwent THBSO for what was found  to be a stage 1b, grade 1 endometrial adenocarcinoma.  Her only poor  prognostic feature was that of focal vascular space invasion.  She was  followed until a Pap smear showed adenocarcinoma recurrent in the vaginal  cuff.  Subsequent evaluation showed the patient to have multiple pulmonary  nodules and a VATS procedure was performed by Dr. Dewayne Shorter revealing  metastatic adenocarcinoma to the lung consistent with endometrial primary.  She was subsequently treated by Dr. Margo Common with six cycles of carboplatin  and Taxol.  Apparently, according to Dr. Bertha Stakes notes she has had a complete  response and complete resolution of her lung nodules.  She has recently seen  Dr. Roselind Messier with consideration of radiation therapy of the vagina to treat  this local disease.  No lesion was noted in the apex of the vagina, but a  nodule was noted in the anterior upper vaginal wall.   PAST MEDICAL HISTORY:   MEDICAL ILLNESSES:  None.   PAST SURGICAL HISTORY:  Cesarean sections, THBSO, cholecystectomy, VATS  procedure.   CURRENT MEDICATIONS:  1.  Folic acid.  2.  Potassium.  3.  Lorazepam.  4.  Ambien.  5.  Gabapentin.  6.  Neurontin.   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  She does not smoke.   FAMILY HISTORY:  The patient's mother had ovarian cancer.  No other family  history  of malignancy noted.   REVIEW OF SYSTEMS:  Negative except as noted above.   PHYSICAL EXAMINATION:  VITAL SIGNS:  Weight 204 pounds, height 5 feet 4  inches, blood pressure 148/92, pulse 100.  GENERAL:  The patient is a mildly obese white female in no acute distress.  HEENT:  Negative.  NECK:  Supple without thyromegaly.  There is no supraclavicular or inguinal  adenopathy.  ABDOMEN:  Soft, nontender.  No masses or organomegaly, ascites, or hernias  are noted.  Pfannenstiel incision is well healed.  PELVIS:  EG/BUS, vagina, urethra are normal to inspection.  However, on  palpation there is an approximately 7-8 mm submucous nodule in the anterior  vagina approximately 2-3 cm from the apex adjacent to the base of the  bladder.  No other lesions are palpated.   PROCEDURE NOTE:  After application of Hurricaine gel, a punch biopsy of this  nodule is obtained.  There is considerable amount of  bleeding, but it is  controlled with Monsel solution.  IMPRESSION:  Recurrent endometrial cancer with probable metastatic disease  in the mid vagina.  The patient is scheduled to see Dr. Roselind Messier again in  consultation this Friday.  We will contact with the reports of this biopsy  so that he may use this information to plan his radiation field or the  extent of his brachytherapy application.      DC/MEDQ  D:  01/27/2005  T:  01/27/2005  Job:  [id number]   cc:   Billie Lade, M.D.  [address] - [facility]  [place]  [place] [address]  Fax: [phone]   Lynett Fish, M.D.  [address] [place]  [place] [address]  Fax: [phone]   Lenoard Aden, M.D.  [address]  [place]  [place] [address]  Fax: [phone]   Telford Nab, R.N.  [address]  [place], [place] [address]

## 2011-02-27 NOTE — Consult Note (Signed)
[name], [name]                ACCOUNT NO.:  [id number]   MEDICAL RECORD NO.:  [id number]          PATIENT TYPE:  OUT   LOCATION:  GYN                          FACILITY:  [facility]   PHYSICIAN:  De Blanch, M.D.DATE OF BIRTH:  Oct 02, 1951   DATE OF CONSULTATION:  11/20/2005  DATE OF DISCHARGE:  11/20/2005                                   CONSULTATION   CHIEF COMPLAINT:  Recurrent endometrial cancer.   INTERVAL HISTORY:  The patient returns today for continuing followup of  recurrent endometrial cancer. She has recurrence in both lung and vagina.  The vaginal recurrence was treated with radiation therapy, while the  pulmonary lesions were treated with chemotherapy. At the completion of  chemotherapy, she apparently had no gross disease. However, on follow-up CT  scan in November 2006, Dr. Cleone Slim discovered that she did have progressive  pulmonary disease, the largest nodule measuring 8 mm in diameter. It was  elected to continue to follow the patient. She continues to do well. Denies  any pulmonary symptoms.  Specifically has no chest pain, cough, shortness of  breath, or hemoptysis. With regard to the pelvis, she denies any pelvic  pain, pressure, vaginal bleeding or  discharge, except for a recent yeast  infection which she treated with Monistat. She has no other GI or GU  symptoms. She continues to work full time.   HISTORY OF PRESENT ILLNESS:  The patient initially underwent surgery for an  endometrial cancer with findings stage I-B grade 1 endometrial cancer  approximately 2 years ago. She had a lymphovascular space invasion but no  other high-risk features and was followed without any subsequent therapy  until she recurred with pulmonary nodules. This was confirmed by a VATS  procedure performed by Dr. Edwyna Shell. Thereafter, she was treated with pelvic  radiation therapy for a vaginal vault recurrence and 6 cycles of carboplatin  and Taxol delivered by Dr. Isabel Caprice.   PAST MEDICAL HISTORY:   MEDICAL ILLNESSES:  Mild obesity.   PAST SURGICAL HISTORY:  1.  Cesarean section.  2.  TAH-BSO.  3.  Cholecystectomy.  4.  VATS procedure.   CURRENT MEDICATIONS:  Folic acid; potassium; lorazepam; Ambien; gabapentin,  Neurontin.   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  She does not smoke.   FAMILY HISTORY:  Mother with ovarian cancer.   REVIEW OF SYSTEMS:  A 10-point comprehensive review of systems negative  except for symptoms as noted above.   PHYSICAL EXAMINATION:  VITAL SIGNS:  Weight 234 pounds (up 7 pounds since  September 2006); blood pressure 130/70; pulse 80; respiratory rate 20.  GENERAL:  Patient is a healthy white female in no acute distress.  HEENT:  Negative.  NECK:  Supple. without thyromegaly.  LYMPHATIC:  There is no supraclavicular or inguinal adenopathy.  ABDOMEN:  Obese, soft, nontender. No mass, organomegaly, ascites. or hernias  noted.  PELVIC:  EG, BUS, vagina, bladder, urethra are normal but atrophic. No  lesions are noted at the cuff. Bimanual and rectovaginal exam reveal no  masses, induration, or nodularity. The patient does have hemorrhoids.   IMPRESSION:  Progressive pulmonary disease which is asymptomatic. The  patient continues to wish to be observed. She is scheduled see Dr. Cleone Slim in  the near future, and she believes another CT scan will be performed at that  time. We will also ask the pathology department to evaluate her pulmonary  nodule for the presence of estrogen and progesterone receptor. If she did  have progesterone receptors, consideration might be given to placing her on  Megace rather than chemotherapy. A Pap smear was obtained today.   The patient was given a prescription for Diflucan 150 ng stat, and will  continue to use Monistat on her vulvar skin externally.      De Blanch, M.D.  Electronically Signed      DC/MEDQ  D:  11/20/2005  T:  11/23/2005  Job:  [id number]   cc:   Billie Lade, M.D.  Fax: [phone]   Lynett Fish, M.D.   Lenoard Aden, M.D.  Fax: [phone]

## 2011-02-27 NOTE — Consult Note (Signed)
NAMEJAYLYNNE, [name]                ACCOUNT NO.:  [id number]   MEDICAL RECORD NO.:  [id number]          PATIENT TYPE:  OUT   LOCATION:  GYN                          FACILITY:  [facility]   PHYSICIAN:  De Blanch, M.D.DATE OF BIRTH:  1951-09-01   DATE OF CONSULTATION:  07/01/2005  DATE OF DISCHARGE:                                   CONSULTATION   A 60 year old white female returns for continuing followup of recurrent  endometrial carcinoma. The patient has recently completed 6 cycles of  carboplatin and taxol chemotherapy as well as vaginal vault radiation.  According to Dr. Bertha Stakes note, she has had a complete response in her chest  and no other evidence of metastatic disease is present. She denies any  pelvic pain or pressure, vaginal bleeding or discharge. Functional status is  excellent.   PAST MEDICAL HISTORY:  Medical illnesses none.   PAST SURGICAL HISTORY:  Cesarean section, TAH/BSO for a stage IB grade 1  endometrial carcinoma, 2004.  VATS procedure.   CURRENT MEDICATIONS:  Folic acid, potassium, lorazepam, Ambien, and  Neurontin.   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   FAMILY HISTORY:  Mother with ovarian cancer, no other family history of  malignancy is noted.   REVIEW OF SYMPTOMS:  A 10 point comprehensive review of systems is negative  except as noted above.   PHYSICAL EXAMINATION:  VITAL SIGNS:  Weight 227 pounds (up 23 pounds) in the  last 5 months. Blood pressure 148/84.  GENERAL:  The patient is an obese white female in no acute distress.  HEENT:  Negative.  NECK:  Supple without thyromegaly. There was no supraclavicular or inguinal  adenopathy.  ABDOMEN:  Soft, nontender, no masses, organomegaly, ascites or hernias are  noted.  PELVIC:  EGBUS, vagina, bladder, urethra are normal. The vaginal cuff is  well healed, well supported and no lesions are noted. Bimanual and  rectovaginal exam reveal no masses, induration or nodularity.   Pap smears were obtained.   IMPRESSION:  Recurrent endometrial cancer with lung and vaginal metastases.  The patient seems to have had a complete response to chemotherapy plus  radiation therapy.   PLAN:  The patient will continue followup with Dr. Cleone Slim and Dr. Roselind Messier. She  continues to have her port flushed by Dr. Cleone Slim and she will return to see me  in 6 months.      De Blanch, M.D.  Electronically Signed     DC/MEDQ  D:  07/01/2005  T:  07/02/2005  Job:  [id number]   cc:   Lynett Fish, M.D.  Fax: [phone]   Billie Lade, M.D.  Fax: [phone]   Lenoard Aden, M.D.  Fax: [phone]   Telford Nab, R.N.  [address]  [place], [place] [address]

## 2011-02-27 NOTE — H&P (Signed)
[facility]  Patient:    [name], [name] Visit Number: [id number] MRN: [id number]          Service Type: Attending:  Lenoard Aden, M.D. Dictated by:   Lenoard Aden, M.D. Adm. Date:  05/08/02   CC:         [facility]   History and Physical  CHIEF COMPLAINT:              Endometrial cancer.  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  Previous history of bilateral oophorectomy completed in 1998.  MEDICATIONS:                  1. Vitamin E.                               2. Vitamin C.                               3. Beta-carotene.  ALLERGIES:                    COMPAZINE.  PAST MEDICAL HISTORY:         History of reportedly diet-controlled diabetes.  SOCIAL HISTORY:               Noncontributory.  FAMILY HISTORY:               Otherwise remarkable for a mother with ovarian cancer.  LABORATORY DATA:              Preoperative workup to include a normal pelvic MRI, a normal chest x-ray, normal CBC and liver function tests.  PHYSICAL EXAMINATION:  GENERAL:                      A well-developed, obese, white female in no apparent distress.  HEENT:                        Normal.  LUNGS:                        Clear.  HEART:                        Regular rate and rhythm.  ABDOMEN:                      Soft, obese, nontender.  PELVIC:                       Exam reveals the uterus to be small and mid plane.  No adnexal masses are appreciated.  EXTREMITIES:                  No cords.  NEUROLOGIC:                   Nonfocal.  IMPRESSION:                   1. Stage I endometrial cancer.  2. Prior bilateral oophorectomy.                               3. Strong family history of  ovarian cancer.  PLAN:                         Proceed with total abdominal hysterectomy, exploratory laparotomy, and pelvic washings.  Risks of anesthesia, infection, bleeding, inability to cure cancer are discussed.  Delayed versus immediate complications to include bowel and bladder injury are noted.  The patient acknowledges and desires to proceed. Dictated by:   Lenoard Aden, M.D. Attending:  Lenoard Aden, M.D. DD:  05/09/02 TD:  05/09/02 Job: [id number]

## 2011-02-27 NOTE — Discharge Summary (Signed)
[name], [name]                ACCOUNT NO.:  [id number]   MEDICAL RECORD NO.:  [id number]          PATIENT TYPE:  INP   LOCATION:  3312                         FACILITY:  [facility]   PHYSICIAN:  Ines Bloomer, M.D. DATE OF BIRTH:  05/12/1951   DATE OF ADMISSION:  DATE OF DISCHARGE:  08/16/2004                                 DISCHARGE SUMMARY   PRIMARY ADMITTING DIAGNOSIS:  Bilateral pulmonary nodules.   ADDITIONAL DISCHARGE DIAGNOSES:  1.  Bilateral pulmonary nodule.  2.  History of stage 1 endometrial carcinoma, status post total abdominal      hysterectomy and oophorectomy with recent recurrence to the vaginal      vault diagnosed in October of 2005.  3.  Borderline diabetes mellitus, currently on no medications.  4.  Metastatic adenocarcinoma of the right lung consistent with metastases      from an endometrial primary.   PROCEDURES PERFORMED:  1.  Right VATS.  2.  Right minithoracotomy with right upper lobe, right middle lobe, and      right lower lobe biopsy.  3.  Placement of left subclavian Port-A-Cath.   HISTORY:  The patient is a 60 year old white female with a history of  endometrial carcinoma status post hysterectomy and oophorectomy in 2003.  She recently underwent a screening CT of the chest which showed bilateral  multiple pulmonary nodules consistent with pulmonary metastases.  She is  followed by Dr. Lynett Fish, and he subsequently referred her to Dr.  Ines Bloomer for evaluation and possible surgical intervention.  It was  Dr. Scheryl Darter opinion that she should proceed with a VATS and resection of  the nodules for definitive diagnosis.  After explanation of the risks,  benefits and alternatives, the patient agreed to proceed.   HOSPITAL COURSE:  She was admitted to [facility] on August 12, 2004.  She was taken to the operating room where she underwent a right VATS  with a right minithoracotomy and biopsies of the right upper, right  middle  and right lower lobes.  Frozen sections were positive for metastatic  endometrial carcinoma, specifically adenocarcinoma.  Because of this  finding, Dr. Edwyna Shell also placed a left subclavian Port-A-Cath at the time of  surgery.  The patient tolerated the procedure well and was transferred to  the 3300 unit in stable condition.   Postoperatively, she has done very well.  Her chest tubes have been removed  in a stepwise fashion.  One small air-leak has resolved and minimal drainage  was noted.  Her followup chest x-rays since her chest tubes have been  removed have shown no evidence of pneumothorax.  She does have some  persistent basilar atelectasis, but this is improved on subsequent films.  She has otherwise done well. At present, she is ambulating independently in  the halls and is making good progress.  She is tolerating a regular diet and  is having normal bowel and bladder function.  She is maintaining O2  saturations of greater than 90% on room air.  Her pain is well controlled  with p.o. pain  medications.  Her lungs are clear.  Her surgical incision  sites are all healing well.  Her most recent labs show sodium of 136,  potassium of 4.4, BUN of 8, creatinine of 0.6.  Hemoglobin 13.3, hematocrit  37.5, white count 14.5, platelets 206,000.  Pathology as mentioned revealed  metastatic adenocarcinoma consistent with endometrial primary.  All margins  negative for tumor.  She will be transferred to the floor today, and will  have a PA and lateral chest x-ray on the morning of August 16, 2004,  provided this has remained stable.  She is otherwise doing well.  She will  hopefully be ready for discharge home at that time.   DISCHARGE MEDICATIONS:  1.  Ultram 50-100 mg q.4h. p.r.n. for pain.  2.  Vitamin C, 500 mg daily.  3.  Calcium 600 mg daily.  4.  Multivitamin daily.  5.  Fish oil one daily.  6.  Acidophilus tablets, one daily.   DISCHARGE INSTRUCTIONS:  1.  She is asked  to refrain from driving, heavy lifting, or strenuous      activity.  2.  She may continue ambulating daily and using her incentive spirometer.  3.  She may shower daily and clean her incisions with soap and water.  4.  She will continue her same preoperative diet.   DISCHARGE FOLLOWUP:  1.  She will see Dr. Edwyna Shell back in the office on Wednesday, November 16, at      1:10 p.m.  She will have a chest x-ray at [facility]      one hour prior to this appointment, and she will bring her films for Dr.      Edwyna Shell to review.  2.  She will also follow up with Dr. Cleone Slim as directed.  3.  If she experiences any problems or has questions in the interim, she is      asked to contact our office immediately.       GC/MEDQ  D:  08/15/2004  T:  08/16/2004  Job:  [id number]   cc:   Lynett Fish, M.D.  [address] [place]  [place] [address]  Fax: [phone]   [facility]  [place]

## 2011-02-27 NOTE — Consult Note (Signed)
NAMEJARELLY, [name]                ACCOUNT NO.:  [id number]   MEDICAL RECORD NO.:  [id number]          PATIENT TYPE:  OUT   LOCATION:  GYN                          FACILITY[facility]  [facility]   PHYSICIAN:  De Blanch, M.D.DATE OF BIRTH:  May 08, 1951   DATE OF CONSULTATION:  04/27/2006  DATE OF DISCHARGE:                                   CONSULTATION   CHIEF COMPLAINT:  Metastatic endometrial cancer.   INTERVAL HISTORY:  Since her last visit, the patient has continued to do  well.  She has known pulmonary metastases but has no pulmonary symptoms.  She recently had a chest x-ray which she reports Dr. Cleone Slim has told her shows  that her pulmonary nodules have resolved, although there is a shadow which  remains and which she claims has been present for many years.  She is  scheduled to have a CT scan by Dr. Cleone Slim in September.  She continues to take  Megace 40 mg t.i.d. and is tolerating it well, although she has had a weight  gain.  She denies any GI or GU symptoms.  Has no pelvic pain, pressure,  vaginal bleeding or discharge.   HISTORY OF PRESENT ILLNESS:  The patient underwent initial surgery for  endometrial cancer approximately two and a half years ago with findings of a  stage IB grade I endometrial cancer.  She did have lymphovascular space  involvement with no other high risk features.  She was followed but  subsequently developed pulmonary nodules confirmed by a VATs procedure as  metastatic endometrial cancer.  She also had a vaginal vault recurrence at  that time. The vaginal vault recurrence is treated with brachytherapy.  She  also received six cycles of carboplatin and Taxol chemotherapy.  She had  persistent disease and the patient was subsequently placed on Megace.   PAST MEDICAL HISTORY:  1.  Obesity.  2.  Diabetes.   PAST SURGICAL HISTORY:  1.  Cesarean section.  2.  TAH/BSO.  3.  Cholecystectomy.  4.  VATs procedure.   CURRENT MEDICATIONS:  Folic acid,  potassium, lorazepam, Ambien, Neurontin.   SOCIAL HISTORY:  The patient is a Occupational psychologist for [employer].  She lives in [place].  She does not smoke.   FAMILY HISTORY:  Mother with ovarian cancer.   REVIEW OF SYSTEMS:  A 10-point comprehensive review of systems negative  except as noted above.   PHYSICAL EXAMINATION:  VITAL SIGNS:  Weight 243 pounds (up 20 pounds from  April of 2007), blood pressure 160/86.  GENERAL:  The patient is a healthy, obese white female in no acute distress.  HEENT:  Negative.  NECK:  Supple without thyromegaly.  There is no supraclavicular or inguinal  adenopathy.  ABDOMEN:  Obese, soft, nontender.  No mass, organomegaly, ascites or hernias  noted.  PELVIC:  EG/BUS, vagina, bladder and urethra are normal.  Cervix are uterus  are surgically absent.  Adnexa without masses.  Rectovaginal exam confirms.  EXTREMITIES:  Lower extremities without edema or varicosities.   IMPRESSION:  Metastatic endometrial cancer doing well on Megace.  Presume  that she is having a response based on the report that she gives me  regarding her chest x-ray.  We will obtain a closer look by performing a CT  scan as scheduled for September.  The patient will return to see Dr. Cleone Slim as  scheduled in September.  She will see Dr. Roselind Messier in October and I will plan  on seeing the patient in January of 2008.  She was given a renewed  prescription for Megace 40 mg t.i.d.      De Blanch, M.D.  Electronically Signed     DC/MEDQ  D:  04/27/2006  T:  04/28/2006  Job:  [id number]   cc:   Lynett Fish, M.D.   Billie Lade, M.D.  Fax: [phone]   Telford Nab, R.N.  [address]  [place], [place] [address]   Lenoard Aden, M.D.  Fax: [phone]

## 2011-02-27 NOTE — H&P (Signed)
[name], [name]                ACCOUNT NO.:  [id number]   MEDICAL RECORD NO.:  [id number]          PATIENT TYPE:  OUT   LOCATION:  GYN                          FACILITY:  [facility]   PHYSICIAN:  Paola A. Duard Brady, MD    DATE OF BIRTH:  1951/10/12   DATE OF ADMISSION:  01/26/2006  DATE OF DISCHARGE:                                HISTORY & PHYSICAL   [name] is a 60 year old who has a history of stage IB, grade I endometrial  carcinoma approximately 2 years ago. She had lymphovascular space invasion  but otherwise had no other risk factors and was followed with no further  therapy. She subsequently had pulmonary metastases diagnosed in November of  2005. She was noted to have metastatic adenocarcinoma in the right middle  lobe, right lower lobe and right upper lobe. She underwent chemotherapy with  Taxol and carboplatin x6 delivered by Dr. Cleone Slim and has been free of disease  since that time. She was recently seen by Dr. Cleone Slim in April, 2007. Her exam  was unremarkable. She was apparently seen in the interim by Dr. Roselind Messier and  there was a concern for a vulvar mass. She comes in today for evaluation.  She states that she has had a weak history of a right vulvar lesion. She was  started on Levaquin which she has been on x5 days and she states that  subsequently the lesion has gotten smaller with some green discharge. She  states the lesion is decreased in size and she has had a decreased amount of  pain. She is a known diabetic though she is not on any medications at this  time. Her Accu-Checks run in the 160s to 180 range. She denies any fevers at  home and is otherwise feeling well. She is planning on going back to work  later today.   PHYSICAL EXAMINATION:  GENERAL:  A morbidly obese female in no acute  distress.  PELVIC:  External genitalia is notable for the right vulva to have a  significant area of erythema. There is a 5 x 4 cm induration. There is a 0.5  cm punctate lesion with  minimal purulent drainage with palpation. There is  no adenopathy. There are diffuse candidal skin changes. The left vulva is  unremarkable. There is no palpable crepitus. The area is well circumscribed  and is not tracking up the mons or into the gluteus.   ASSESSMENT:  A 60 year old with history of recurrent stage IB endometrial  carcinoma who has a vulvar abscess and is on Levaquin. By history as well as  by clinical examination it appears that is getting smaller and it is  draining. I have encouraged her to continue her antibiotics. She will start  taking Diflucan 150 mg today for her candidal infection. She was asked to do  Sitz baths to see if more drainage could be expressed without significant  manipulation of the lesion. I have asked that she return to clinic in 24  hours for re-evaluation. If this has not continued to improve I think she  will require admission for  intravenous antibiotics and possible surgical  intervention. At this point I believe that we can follow her expectantly  until tomorrow. She has an appointment at 9:15. Her questions were listed  and answered to her satisfaction, and she will keep her appointment  tomorrow.      Paola A. Duard Brady, MD  Electronically Signed     PAG/MEDQ  D:  01/26/2006  T:  01/26/2006  Job:  [id number]

## 2011-10-12 ENCOUNTER — Other Ambulatory Visit: Payer: Self-pay | Admitting: Gynecology

## 2011-10-12 DIAGNOSIS — Z1231 Encounter for screening mammogram for malignant neoplasm of breast: Secondary | ICD-10-CM

## 2011-11-16 ENCOUNTER — Ambulatory Visit: Payer: BC Managed Care – PPO

## 2011-11-27 ENCOUNTER — Ambulatory Visit
Admission: RE | Admit: 2011-11-27 | Discharge: 2011-11-27 | Disposition: A | Discharge status: Home / Self Care | Payer: BC Managed Care – PPO | Source: Ambulatory Visit | Attending: Gynecology | Admitting: Gynecology

## 2011-11-27 DIAGNOSIS — Z1231 Encounter for screening mammogram for malignant neoplasm of breast: Secondary | ICD-10-CM

## 2012-01-20 ENCOUNTER — Other Ambulatory Visit: Payer: Self-pay | Admitting: *Deleted

## 2012-01-21 ENCOUNTER — Other Ambulatory Visit: Payer: Self-pay | Admitting: *Deleted

## 2012-01-21 DIAGNOSIS — C541 Malignant neoplasm of endometrium: Secondary | ICD-10-CM

## 2012-02-02 ENCOUNTER — Other Ambulatory Visit: Payer: Self-pay | Admitting: Gynecologic Oncology

## 2012-02-02 DIAGNOSIS — C541 Malignant neoplasm of endometrium: Secondary | ICD-10-CM

## 2012-03-01 ENCOUNTER — Ambulatory Visit (HOSPITAL_COMMUNITY)
Admission: RE | Admit: 2012-03-01 | Discharge: 2012-03-01 | Disposition: A | Discharge status: Home / Self Care | Payer: BC Managed Care – PPO | Source: Ambulatory Visit | Attending: Gynecologic Oncology | Admitting: Gynecologic Oncology

## 2012-03-01 ENCOUNTER — Telehealth: Payer: Self-pay | Admitting: Gynecologic Oncology

## 2012-03-01 ENCOUNTER — Inpatient Hospital Stay (HOSPITAL_COMMUNITY): Admission: RE | Admit: 2012-03-01 | Payer: BC Managed Care – PPO | Source: Ambulatory Visit

## 2012-03-01 DIAGNOSIS — C541 Malignant neoplasm of endometrium: Secondary | ICD-10-CM

## 2012-03-01 DIAGNOSIS — Z79899 Other long term (current) drug therapy: Secondary | ICD-10-CM | POA: Insufficient documentation

## 2012-03-01 DIAGNOSIS — C549 Malignant neoplasm of corpus uteri, unspecified: Secondary | ICD-10-CM | POA: Insufficient documentation

## 2012-03-01 MED ORDER — IOHEXOL 300 MG/ML  SOLN
100.0000 mL | Freq: Once | INTRAMUSCULAR | Status: AC | PRN
  Administered 2012-03-01: 100 mL via INTRAVENOUS

## 2012-03-01 NOTE — Telephone Encounter (Signed)
Message left for patient with CT scan results: no evidence of metastatic disease.  Instructed to call for any questions or concerns.

## 2012-03-08 ENCOUNTER — Encounter: Payer: Self-pay | Admitting: Gynecologic Oncology

## 2012-03-09 ENCOUNTER — Encounter: Payer: Self-pay | Admitting: Gynecology

## 2012-03-09 ENCOUNTER — Ambulatory Visit: Payer: BC Managed Care – PPO | Attending: Gynecology | Admitting: Gynecology

## 2012-03-09 VITALS — BP 142/72 | HR 80 | Temp 98.2°F | Resp 18 | Ht 65.0 in | Wt 230.0 lb

## 2012-03-09 DIAGNOSIS — I10 Essential (primary) hypertension: Secondary | ICD-10-CM | POA: Insufficient documentation

## 2012-03-09 DIAGNOSIS — Z9221 Personal history of antineoplastic chemotherapy: Secondary | ICD-10-CM | POA: Insufficient documentation

## 2012-03-09 DIAGNOSIS — K5900 Constipation, unspecified: Secondary | ICD-10-CM | POA: Insufficient documentation

## 2012-03-09 DIAGNOSIS — E669 Obesity, unspecified: Secondary | ICD-10-CM | POA: Insufficient documentation

## 2012-03-09 DIAGNOSIS — C541 Malignant neoplasm of endometrium: Secondary | ICD-10-CM

## 2012-03-09 DIAGNOSIS — Z09 Encounter for follow-up examination after completed treatment for conditions other than malignant neoplasm: Secondary | ICD-10-CM | POA: Insufficient documentation

## 2012-03-09 DIAGNOSIS — E119 Type 2 diabetes mellitus without complications: Secondary | ICD-10-CM | POA: Insufficient documentation

## 2012-03-09 DIAGNOSIS — Z90710 Acquired absence of both cervix and uterus: Secondary | ICD-10-CM | POA: Insufficient documentation

## 2012-03-09 DIAGNOSIS — Z8542 Personal history of malignant neoplasm of other parts of uterus: Secondary | ICD-10-CM | POA: Insufficient documentation

## 2012-03-09 NOTE — Patient Instructions (Signed)
We will obtain a CT scan of the chest abdomen and pelvis in one year. Return to see me after the CT scan. Continue Megace 40 mg 3 times a day

## 2012-03-09 NOTE — Progress Notes (Signed)
Consult Note: Gyn-Onc   Tyson Babinski 61 y.o. female  Chief Complaint  Patient presents with  . Cancer    endometrial cancer    Interval History:  The patient returns today as previously scheduled for followup. Last visit she been on a weight reducing diet has lost approximately 24 pounds. She had a CT scan of the chest, abdomen, and pelvis in April which is entirely negative. She continues to take Megace 40 mg 3 times a day without any difficulty. She is having some problems with constipation and is seen a gastroenterologist later today. She is also having some difficulties with sleep but is trying to use melatonin as an aide. Otherwise she has no other GI GU or pelvic symptoms.  HPI the patient initially underwent surgery in July 2003 for a stage IB grade 1 endometrial cancer. In September of 2005 she had recurrent disease and vagina and pulmonary metastases. These were biopsy-proven. She was treated with 6 cycles of carboplatin and Taxol and had a partial response. Subsequently we placed her on Megace and she had a complete response with resolution of all of the pulmonary nodules. She received pelvic radiation therapy for the vaginal recurrence.   Review of Systems:10 point review of systems is negative as noted above.   Vitals: Blood pressure 142/72, pulse 80, temperature 98.2 F (36.8 C), resp. rate 18, height 5\' 5"  (1.651 m), weight 230 lb (104.327 kg).  Physical Exam: General : The patient is a healthy obese woman in no acute distress.  HEENT: normocephalic, extraoccular movements normal; neck is supple without thyromegally  Lynphnodes: Supraclavicular and inguinal nodes not enlarged  Abdomen: Soft, non-tender, no ascites, no organomegally, no masses, no hernias  Pelvic:  EGBUS: Normal female  Vagina: Normal, no lesions  Urethra and Bladder: Normal, non-tender  Cervix: Surgically absent  Uterus: Surgically absent  Bi-manual examination: Non-tender; no adenxal masses or  nodularity  Rectal: normal sphincter tone, no masses, no blood  Lower extremities: No edema or varicosities. Normal range of motion    Assessment/Plan: Recurrent endometrial cancer remaining in remission. We will continue Megace 40 mg 3 times a day. She'll have a CT scan of the chest abdomen and pelvis in one year return to see me shortly after that scan. She's encouraged to continue working on weight loss. She will continue to have a mammogram as annually.  Allergies  Allergen Reactions  . Compazine     Past Medical History  Diagnosis Date  . Diabetes mellitus   . Hypertension   . Obesity   . Endometrial cancer     Metastatic, Stage IB grade 1    Past Surgical History  Procedure Date  . Abdominal hysterectomy 04/2002    TAH, BSO  . Cesarean section   . Cholecystectomy     No current outpatient prescriptions on file.    History   Social History  . Marital Status: Divorced    Spouse Name: N/A    Number of Children: N/A  . Years of Education: N/A   Occupational History  . Not on file.   Social History Main Topics  . Smoking status: Never Smoker   . Smokeless tobacco: Not on file  . Alcohol Use:   . Drug Use:   . Sexually Active:    Other Topics Concern  . Not on file   Social History Narrative  . No narrative on file    Family History  Problem Relation Age of Onset  . Ovarian cancer Mother  Jeannette Corpus, MD 03/09/2012, 8:32 AM

## 2012-04-21 ENCOUNTER — Encounter (HOSPITAL_COMMUNITY): Payer: Self-pay | Admitting: Dietician

## 2012-04-21 NOTE — Progress Notes (Signed)
[facility] Diabetes Class Completion  Date:April 21, 2012  Time: 6:30 PM  Pt attended [facility]'s Diabetes Class on April 21, 2012.   Patient was educated on the following topics: carbohydrate metabolism in relation to diabetes, sources of carbohydrate, carbohydrate counting, meal planning strategies, food label reading, and portion control.   Genita Nilsson A. Kayan, RD, LDN Date:April 21, 2012 Time: 6:30 PM 

## 2012-11-16 ENCOUNTER — Other Ambulatory Visit: Payer: Self-pay | Admitting: Gynecology

## 2012-11-16 DIAGNOSIS — Z9889 Other specified postprocedural states: Secondary | ICD-10-CM

## 2012-11-16 DIAGNOSIS — Z1231 Encounter for screening mammogram for malignant neoplasm of breast: Secondary | ICD-10-CM

## 2012-11-28 ENCOUNTER — Ambulatory Visit
Admission: RE | Admit: 2012-11-28 | Discharge: 2012-11-28 | Disposition: A | Discharge status: Home / Self Care | Payer: BC Managed Care – PPO | Source: Ambulatory Visit | Attending: Gynecology | Admitting: Gynecology

## 2012-11-28 DIAGNOSIS — Z1231 Encounter for screening mammogram for malignant neoplasm of breast: Secondary | ICD-10-CM

## 2012-11-28 DIAGNOSIS — Z9889 Other specified postprocedural states: Secondary | ICD-10-CM

## 2013-09-11 ENCOUNTER — Other Ambulatory Visit: Payer: Self-pay | Admitting: Gynecologic Oncology

## 2013-09-11 DIAGNOSIS — C541 Malignant neoplasm of endometrium: Secondary | ICD-10-CM

## 2013-09-22 ENCOUNTER — Ambulatory Visit (HOSPITAL_COMMUNITY)
Admission: RE | Admit: 2013-09-22 | Discharge: 2013-09-22 | Disposition: A | Discharge status: Home / Self Care | Payer: BC Managed Care – PPO | Source: Ambulatory Visit | Attending: Gynecologic Oncology | Admitting: Gynecologic Oncology

## 2013-09-22 ENCOUNTER — Telehealth: Payer: Self-pay | Admitting: Gynecologic Oncology

## 2013-09-22 DIAGNOSIS — C549 Malignant neoplasm of corpus uteri, unspecified: Secondary | ICD-10-CM | POA: Insufficient documentation

## 2013-09-22 DIAGNOSIS — C541 Malignant neoplasm of endometrium: Secondary | ICD-10-CM

## 2013-09-22 DIAGNOSIS — Z923 Personal history of irradiation: Secondary | ICD-10-CM | POA: Insufficient documentation

## 2013-09-22 DIAGNOSIS — C7800 Secondary malignant neoplasm of unspecified lung: Secondary | ICD-10-CM | POA: Insufficient documentation

## 2013-09-22 DIAGNOSIS — Z9221 Personal history of antineoplastic chemotherapy: Secondary | ICD-10-CM | POA: Insufficient documentation

## 2013-09-22 MED ORDER — IOHEXOL 300 MG/ML  SOLN
100.0000 mL | Freq: Once | INTRAMUSCULAR | Status: AC | PRN
  Administered 2013-09-22: 100 mL via INTRAVENOUS

## 2013-09-22 MED ORDER — SODIUM CHLORIDE 0.9 % IJ SOLN
INTRAMUSCULAR | Status: AC
  Filled 2013-09-22: qty 500

## 2013-09-22 NOTE — Telephone Encounter (Signed)
Message left with CT scan results.  Instructed to call the office for any questions or concerns.

## 2013-09-29 ENCOUNTER — Ambulatory Visit: Payer: BC Managed Care – PPO | Admitting: Gynecology

## 2013-10-20 ENCOUNTER — Ambulatory Visit: Payer: BC Managed Care – PPO | Attending: Gynecology | Admitting: Gynecology

## 2013-10-20 ENCOUNTER — Encounter: Payer: Self-pay | Admitting: *Deleted

## 2013-10-20 ENCOUNTER — Encounter: Payer: Self-pay | Admitting: Gynecology

## 2013-10-20 ENCOUNTER — Other Ambulatory Visit (HOSPITAL_COMMUNITY)
Admission: RE | Admit: 2013-10-20 | Discharge: 2013-10-20 | Disposition: A | Discharge status: Home / Self Care | Payer: BC Managed Care – PPO | Source: Ambulatory Visit | Attending: Gynecology | Admitting: Gynecology

## 2013-10-20 VITALS — BP 168/80 | HR 80 | Temp 98.4°F | Resp 20 | Ht 63.35 in | Wt 201.6 lb

## 2013-10-20 DIAGNOSIS — E669 Obesity, unspecified: Secondary | ICD-10-CM | POA: Insufficient documentation

## 2013-10-20 DIAGNOSIS — Z79899 Other long term (current) drug therapy: Secondary | ICD-10-CM | POA: Insufficient documentation

## 2013-10-20 DIAGNOSIS — C541 Malignant neoplasm of endometrium: Secondary | ICD-10-CM

## 2013-10-20 DIAGNOSIS — E119 Type 2 diabetes mellitus without complications: Secondary | ICD-10-CM | POA: Insufficient documentation

## 2013-10-20 DIAGNOSIS — Z09 Encounter for follow-up examination after completed treatment for conditions other than malignant neoplasm: Secondary | ICD-10-CM | POA: Insufficient documentation

## 2013-10-20 DIAGNOSIS — Z90710 Acquired absence of both cervix and uterus: Secondary | ICD-10-CM | POA: Insufficient documentation

## 2013-10-20 DIAGNOSIS — Z8542 Personal history of malignant neoplasm of other parts of uterus: Secondary | ICD-10-CM | POA: Insufficient documentation

## 2013-10-20 DIAGNOSIS — Z124 Encounter for screening for malignant neoplasm of cervix: Secondary | ICD-10-CM | POA: Insufficient documentation

## 2013-10-20 DIAGNOSIS — I10 Essential (primary) hypertension: Secondary | ICD-10-CM | POA: Insufficient documentation

## 2013-10-20 DIAGNOSIS — Z9221 Personal history of antineoplastic chemotherapy: Secondary | ICD-10-CM | POA: Insufficient documentation

## 2013-10-20 DIAGNOSIS — Z9079 Acquired absence of other genital organ(s): Secondary | ICD-10-CM | POA: Insufficient documentation

## 2013-10-20 DIAGNOSIS — Z923 Personal history of irradiation: Secondary | ICD-10-CM | POA: Insufficient documentation

## 2013-10-20 NOTE — Patient Instructions (Signed)
Please take Megace 3 times a day.  We will contact you the Pap smear report.  Return to see [place] in one year.

## 2013-10-20 NOTE — Progress Notes (Signed)
Consult Note: Gyn-Onc   [name] 63 y.o. female  No chief complaint on file.   Assessment: Recurrent endometrial cancer currently clinically free of disease.  Plan: Pap smears are obtained. The patient is given a new prescription for Megace 40 mg 3 times a day. She return to see [place] in one year. We will no longer do CT scans.  Interval History:  The patient returns today as previously scheduled for followup. Since her last visit she been on a weight reducing diet has lost another 29 pounds. She had a CT scan of the chest, abdomen, and pelvis in descent #2014 which is entirely negative. She continues to take Megace 40 mg 3 times a day without any difficulty.   Otherwise she has no other GI GU or pelvic symptoms.  HPI the patient initially underwent surgery in July 2003 for a stage IB grade 1 endometrial cancer. In September of 2005 she had recurrent disease and vagina and pulmonary metastases. These were biopsy-proven. She was treated with 6 cycles of carboplatin and Taxol and had a partial response. Subsequently we placed her on Megace and she had a complete response with resolution of all of the pulmonary nodules. She received pelvic radiation therapy for the vaginal recurrence.   Review of Systems:10 point review of systems is negative as noted above.   Vitals: Blood pressure 168/80, pulse 80, temperature 98.4 F (36.9 C), temperature source Oral, resp. rate 20, height 5' 3.35" (1.609 m), weight 201 lb 9.6 oz (91.445 kg).  Physical Exam: General : The patient is a healthy obese woman in no acute distress.  HEENT: normocephalic, extraoccular movements normal; neck is supple without thyromegally  Lynphnodes: Supraclavicular and inguinal nodes not enlarged  Abdomen: Soft, non-tender, no ascites, no organomegally, no masses, no hernias  Pelvic:  EGBUS: Normal female  Vagina: Normal, no lesions  Urethra and Bladder: Normal, non-tender  Cervix: Surgically absent  Uterus: Surgically  absent  Bi-manual examination: Non-tender; no adenxal masses or nodularity  Rectal: normal sphincter tone, no masses, no blood  Lower extremities: No edema or varicosities. Normal range of motion    Assessment/Plan: Recurrent endometrial cancer remaining in remission. We will continue Megace 40 mg 3 times a day. She'll have a CT scan of the chest abdomen and pelvis in one year return to see me shortly after that scan. She's encouraged to continue working on weight loss. She will continue to have a mammogram as annually.  Allergies  Allergen Reactions  . Compazine     Past Medical History  Diagnosis Date  . Diabetes mellitus   . Hypertension   . Obesity   . Endometrial cancer     Metastatic, Stage IB grade 1    Past Surgical History  Procedure Laterality Date  . Abdominal hysterectomy  04/2002    TAH, BSO  . Cesarean section    . Cholecystectomy      Current Outpatient Prescriptions  Medication Sig Dispense Refill  . Canagliflozin (INVOKANA) 100 MG TABS Take 1 each by mouth daily.      . Coenzyme Q10 (CO Q 10 PO) Take 2 each by mouth daily.      Marland Kitchen lisinopril (PRINIVIL,ZESTRIL) 20 MG tablet Take 20 mg by mouth daily.      . megestrol (MEGACE) 40 MG tablet Take 40 mg by mouth 3 (three) times daily.      . Multiple Vitamin (MULTI VITAMIN DAILY PO) Take 1 each by mouth daily.      Marland Kitchen  Omega-3 Fatty Acids (FISH OIL) 1000 MG CAPS Take 2 each by mouth daily.       No current facility-administered medications for this visit.    History   Social History  . Marital Status: Divorced    Spouse Name: N/A    Number of Children: N/A  . Years of Education: N/A   Occupational History  . Not on file.   Social History Main Topics  . Smoking status: Never Smoker   . Smokeless tobacco: Not on file  . Alcohol Use:   . Drug Use:   . Sexual Activity:    Other Topics Concern  . Not on file   Social History Narrative  . No narrative on file    Family History  Problem Relation  Age of Onset  . Ovarian cancer Mother       Alvino Chapel, MD 10/20/2013, 9:51 AM

## 2013-10-20 NOTE — Addendum Note (Signed)
Addended by: Lucile Crater on: 10/20/2013 10:35 AM   Modules accepted: Orders

## 2013-10-26 ENCOUNTER — Telehealth: Payer: Self-pay | Admitting: *Deleted

## 2013-10-26 NOTE — Telephone Encounter (Signed)
Per NP, notified pt lmvom pap results normal. Pt to call with any concerns.

## 2013-10-26 NOTE — Telephone Encounter (Signed)
Message copied by GARNER, Aletha Halim on Thu Oct 26, 2013 11:28 AM ------      Message from: CROSS, MELISSA D      Created: Thu Oct 26, 2013  9:39 AM       Normal       ----- Message -----         From: [facility] Interface         Sent: 10/25/2013   3:36 PM           To: Dorothyann Gibbs, NP                   ------

## 2013-11-20 ENCOUNTER — Other Ambulatory Visit: Payer: Self-pay

## 2013-11-20 DIAGNOSIS — Z9889 Other specified postprocedural states: Secondary | ICD-10-CM

## 2013-11-20 DIAGNOSIS — Z1231 Encounter for screening mammogram for malignant neoplasm of breast: Secondary | ICD-10-CM

## 2013-11-30 ENCOUNTER — Ambulatory Visit
Admission: RE | Admit: 2013-11-30 | Discharge: 2013-11-30 | Disposition: A | Discharge status: Home / Self Care | Payer: BC Managed Care – PPO | Source: Ambulatory Visit

## 2013-11-30 DIAGNOSIS — Z1231 Encounter for screening mammogram for malignant neoplasm of breast: Secondary | ICD-10-CM

## 2013-11-30 DIAGNOSIS — Z9889 Other specified postprocedural states: Secondary | ICD-10-CM

## 2014-12-21 ENCOUNTER — Other Ambulatory Visit (HOSPITAL_COMMUNITY)
Admission: RE | Admit: 2014-12-21 | Discharge: 2014-12-21 | Disposition: A | Discharge status: Home / Self Care | Payer: BLUE CROSS/BLUE SHIELD | Source: Ambulatory Visit | Attending: Gynecology | Admitting: Gynecology

## 2014-12-21 ENCOUNTER — Ambulatory Visit: Payer: BLUE CROSS/BLUE SHIELD | Attending: Gynecology | Admitting: Gynecology

## 2014-12-21 ENCOUNTER — Encounter: Payer: Self-pay | Admitting: Gynecology

## 2014-12-21 ENCOUNTER — Other Ambulatory Visit: Payer: Self-pay | Admitting: Gynecology

## 2014-12-21 VITALS — BP 157/76 | HR 84 | Temp 98.3°F | Resp 18 | Ht 63.0 in | Wt 203.1 lb

## 2014-12-21 DIAGNOSIS — Z90710 Acquired absence of both cervix and uterus: Secondary | ICD-10-CM | POA: Insufficient documentation

## 2014-12-21 DIAGNOSIS — E669 Obesity, unspecified: Secondary | ICD-10-CM | POA: Diagnosis not present

## 2014-12-21 DIAGNOSIS — Z1231 Encounter for screening mammogram for malignant neoplasm of breast: Secondary | ICD-10-CM

## 2014-12-21 DIAGNOSIS — E119 Type 2 diabetes mellitus without complications: Secondary | ICD-10-CM | POA: Insufficient documentation

## 2014-12-21 DIAGNOSIS — I10 Essential (primary) hypertension: Secondary | ICD-10-CM | POA: Diagnosis not present

## 2014-12-21 DIAGNOSIS — Z8542 Personal history of malignant neoplasm of other parts of uterus: Secondary | ICD-10-CM

## 2014-12-21 DIAGNOSIS — Z01411 Encounter for gynecological examination (general) (routine) with abnormal findings: Secondary | ICD-10-CM | POA: Insufficient documentation

## 2014-12-21 DIAGNOSIS — Z79899 Other long term (current) drug therapy: Secondary | ICD-10-CM | POA: Insufficient documentation

## 2014-12-21 DIAGNOSIS — C541 Malignant neoplasm of endometrium: Secondary | ICD-10-CM

## 2014-12-21 NOTE — Addendum Note (Signed)
Addended by: Christa See on: 12/21/2014 09:15 AM   Modules accepted: Orders

## 2014-12-21 NOTE — Progress Notes (Signed)
Consult Note: Gyn-Onc   [name] 64 y.o. female  Chief Complaint  Patient presents with  . Endometrial Cancer    Follow up    Assessment: Recurrent endometrial cancer currently clinically free of disease.  Plan: Pap smears are obtained. We will discontinue Megace. She return to see [place] in one year.  She's encouraged to continue weight loss.   Interval History:  The patient returns today as previously scheduled for followup. She has continued to take Megace 40 mg 3 times a day without any difficulty.   Otherwise she has no other GI GU or pelvic symptoms.  HPI  In 2003 the patient underwent abdominal hysterectomy for a stage IB grade 1 endometrial carcinoma. With good prognostic features she had no subsequent therapy. 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 These were biopsied. She was then treated with 6 cycles of carboplatin and Taxol had a partial response. Subsequently she was placed on Megace 40 mg 3 times a day. She then had a complete response with resolution of all pulmonary nodules. She's continued to take Megace for subsequent 10 years. She also received pelvic radiation therapy to treat the vaginal recurrence.  Review of Systems:10 point review of systems is negative as noted above.   Vitals: Blood pressure 157/76, pulse 84, temperature 98.3 F (36.8 C), temperature source Oral, resp. rate 18, height 5\' 3"  (1.6 m), weight 203 lb 1.6 oz (92.126 kg).  Physical Exam: General : The patient is a healthy obese woman in no acute distress.  HEENT: normocephalic, extraoccular movements normal; neck is supple without thyromegally  Lynphnodes: Supraclavicular and inguinal nodes not enlarged  Abdomen: Soft, non-tender, no ascites, no organomegally, no masses, no hernias  Pelvic:  EGBUS: Normal female  Vagina: Normal, no lesions  Urethra and Bladder: Normal, non-tender  Cervix: Surgically absent   Uterus: Surgically absent  Bi-manual examination: Non-tender; no adenxal masses or nodularity  Rectal: normal sphincter tone, no masses, no blood  Lower extremities: No edema or varicosities. Normal range of motion     Allergies  Allergen Reactions  . Compazine     Past Medical History  Diagnosis Date  . Diabetes mellitus   . Hypertension   . Obesity   . Endometrial cancer     Metastatic, Stage IB grade 1    Past Surgical History  Procedure Laterality Date  . Abdominal hysterectomy  04/2002    TAH, BSO  . Cesarean section    . Cholecystectomy      Current Outpatient Prescriptions  Medication Sig Dispense Refill  . Canagliflozin (INVOKANA) 100 MG TABS Take 1 each by mouth daily.    . Coenzyme Q10 (CO Q 10 PO) Take 2 each by mouth daily.    . Linagliptin-Metformin HCl (JENTADUETO) 2.02-999 MG TABS Take 1 each by mouth 2 (two) times daily.    Marland Kitchen lisinopril (PRINIVIL,ZESTRIL) 20 MG tablet Take 20 mg by mouth daily.    . megestrol (MEGACE) 40 MG tablet Take 40 mg by mouth 3 (three) times daily.    . Multiple Vitamin (MULTI VITAMIN DAILY PO) Take 1 each by mouth daily.    . Omega-3 Fatty Acids (FISH OIL) 1000 MG CAPS Take 2 each by mouth daily.     No current facility-administered medications for this visit.    History   Social History  . Marital Status: Divorced    Spouse Name: N/A  . Number of Children: N/A  .  Years of Education: N/A   Occupational History  . Not on file.   Social History Main Topics  . Smoking status: Never Smoker   . Smokeless tobacco: Not on file  . Alcohol Use: Not on file  . Drug Use: Not on file  . Sexual Activity: Not on file   Other Topics Concern  . Not on file   Social History Narrative    Family History  Problem Relation Age of Onset  . Ovarian cancer Mother       Alvino Chapel, MD 12/21/2014, 8:51 AM

## 2014-12-21 NOTE — Patient Instructions (Signed)
Stop megace as instructed by Dr. Fermin Schwab. We will call you pap smear results. Call at the end of the year to schedule a followup in March 2017.

## 2014-12-24 ENCOUNTER — Ambulatory Visit (HOSPITAL_COMMUNITY)
Admission: RE | Admit: 2014-12-24 | Discharge: 2014-12-24 | Disposition: A | Discharge status: Home / Self Care | Payer: BLUE CROSS/BLUE SHIELD | Source: Ambulatory Visit | Attending: Gynecology | Admitting: Gynecology

## 2014-12-24 DIAGNOSIS — Z1231 Encounter for screening mammogram for malignant neoplasm of breast: Secondary | ICD-10-CM | POA: Diagnosis present

## 2014-12-26 LAB — CYTOLOGY - PAP

## 2014-12-27 ENCOUNTER — Ambulatory Visit (HOSPITAL_COMMUNITY): Payer: BLUE CROSS/BLUE SHIELD

## 2014-12-27 ENCOUNTER — Telehealth: Payer: Self-pay | Admitting: *Deleted

## 2014-12-27 NOTE — Telephone Encounter (Signed)
Per Joylene John, NP patient notified of normal pap smear - patient appreciative of call.

## 2015-01-14 DIAGNOSIS — I517 Cardiomegaly: Secondary | ICD-10-CM

## 2015-01-14 DIAGNOSIS — I5189 Other ill-defined heart diseases: Secondary | ICD-10-CM

## 2015-01-14 DIAGNOSIS — I358 Other nonrheumatic aortic valve disorders: Secondary | ICD-10-CM

## 2015-01-14 HISTORY — DX: Other ill-defined heart diseases: I51.89

## 2015-01-14 HISTORY — DX: Other nonrheumatic aortic valve disorders: I35.8

## 2015-01-14 HISTORY — DX: Cardiomegaly: I51.7

## 2015-01-22 ENCOUNTER — Ambulatory Visit (INDEPENDENT_AMBULATORY_CARE_PROVIDER_SITE_OTHER): Payer: BLUE CROSS/BLUE SHIELD | Admitting: Cardiology

## 2015-01-22 ENCOUNTER — Encounter: Payer: Self-pay | Admitting: Cardiology

## 2015-01-22 VITALS — BP 140/78 | HR 84 | Ht 64.0 in | Wt 199.2 lb

## 2015-01-22 DIAGNOSIS — R011 Cardiac murmur, unspecified: Secondary | ICD-10-CM | POA: Diagnosis not present

## 2015-01-22 NOTE — Patient Instructions (Signed)
Your physician recommends that you schedule a follow-up appointment in: to be determined    Your physician has requested that you have an echocardiogram. Echocardiography is a painless test that uses sound waves to create images of your heart. It provides your doctor with information about the size and shape of your heart and how well your heart's chambers and valves are working. This procedure takes approximately one hour. There are no restrictions for this procedure.      Thank you for choosing [facility] !

## 2015-01-22 NOTE — Progress Notes (Signed)
     Clinical Summary Ms. Kemp is a 64 y.o.female seen today as a new patient for the following medical problems.  1. Heart murmur - no SOB or DOE. No chest pain, no lightheadedness or dizziness. - walks a mile 3 times a week without limitations - no LE edema    Past Medical History  Diagnosis Date  . Diabetes mellitus   . Hypertension   . Obesity   . Endometrial cancer     Metastatic, Stage IB grade 1     Allergies  Allergen Reactions  . Compazine      Current Outpatient Prescriptions  Medication Sig Dispense Refill  . Canagliflozin (INVOKANA) 100 MG TABS Take 1 each by mouth daily.    . Coenzyme Q10 (CO Q 10 PO) Take 2 each by mouth daily.    . Linagliptin-Metformin HCl (JENTADUETO) 2.02-999 MG TABS Take 1 each by mouth 2 (two) times daily.    Marland Kitchen lisinopril (PRINIVIL,ZESTRIL) 20 MG tablet Take 20 mg by mouth daily.    . megestrol (MEGACE) 40 MG tablet Take 40 mg by mouth 3 (three) times daily.    . Multiple Vitamin (MULTI VITAMIN DAILY PO) Take 1 each by mouth daily.    . Omega-3 Fatty Acids (FISH OIL) 1000 MG CAPS Take 2 each by mouth daily.     No current facility-administered medications for this visit.     Past Surgical History  Procedure Laterality Date  . Abdominal hysterectomy  04/2002    TAH, BSO  . Cesarean section    . Cholecystectomy       Allergies  Allergen Reactions  . Compazine       Family History  Problem Relation Age of Onset  . Ovarian cancer Mother      Social History Ms. Chesney reports that she has never smoked. She does not have any smokeless tobacco history on file. Ms. Garguilo has no alcohol history on file.   Review of Systems CONSTITUTIONAL: No weight loss, fever, chills, weakness or fatigue.  HEENT: Eyes: No visual loss, blurred vision, double vision or yellow sclerae.No hearing loss, sneezing, congestion, runny nose or sore throat.  SKIN: No rash or itching.  CARDIOVASCULAR: per HPI RESPIRATORY: No shortness of breath,  cough or sputum.  GASTROINTESTINAL: No anorexia, nausea, vomiting or diarrhea. No abdominal pain or blood.  GENITOURINARY: No burning on urination, no polyuria NEUROLOGICAL: No headache, dizziness, syncope, paralysis, ataxia, numbness or tingling in the extremities. No change in bowel or bladder control.  MUSCULOSKELETAL: No muscle, back pain, joint pain or stiffness.  LYMPHATICS: No enlarged nodes. No history of splenectomy.  PSYCHIATRIC: No history of depression or anxiety.  ENDOCRINOLOGIC: No reports of sweating, cold or heat intolerance. No polyuria or polydipsia.  Marland Kitchen   Physical Examination p 84 bp 140/78 Wt 199 lbs BMI 34 Gen: resting comfortably, no acute distress HEENT: no scleral icterus, pupils equal round and reactive, no palptable cervical adenopathy,  CV: RRR, no m/r/g, no JVD, no carotid bruits Resp: Clear to auscultation bilaterally GI: abdomen is soft, non-tender, non-distended, normal bowel sounds, no hepatosplenomegaly MSK: extremities are warm, no edema.  Skin: warm, no rash Neuro:  no focal deficits Psych: appropriate affect   Clinic EKG SR, RBBB, probable LAFB.   Assessment and Plan  1. Heart murmur - will obtain echo to further evaluate    F/u pending echo results  Arnoldo Lenis, M.D.

## 2015-01-24 ENCOUNTER — Ambulatory Visit (HOSPITAL_COMMUNITY)
Admission: RE | Admit: 2015-01-24 | Discharge: 2015-01-24 | Disposition: A | Discharge status: Home / Self Care | Payer: BLUE CROSS/BLUE SHIELD | Source: Ambulatory Visit | Attending: Cardiology | Admitting: Cardiology

## 2015-01-24 DIAGNOSIS — E119 Type 2 diabetes mellitus without complications: Secondary | ICD-10-CM | POA: Diagnosis not present

## 2015-01-24 DIAGNOSIS — R011 Cardiac murmur, unspecified: Secondary | ICD-10-CM

## 2015-01-24 DIAGNOSIS — I10 Essential (primary) hypertension: Secondary | ICD-10-CM | POA: Diagnosis not present

## 2015-01-24 DIAGNOSIS — I517 Cardiomegaly: Secondary | ICD-10-CM

## 2015-01-24 HISTORY — DX: Cardiomegaly: I51.7

## 2015-01-24 NOTE — Progress Notes (Signed)
*  PRELIMINARY RESULTS* Echocardiogram 2D Echocardiogram has been performed.  Leavy Cella 01/24/2015, 10:55 AM

## 2015-11-26 ENCOUNTER — Other Ambulatory Visit: Payer: Self-pay | Admitting: Gynecology

## 2015-11-26 DIAGNOSIS — Z1231 Encounter for screening mammogram for malignant neoplasm of breast: Secondary | ICD-10-CM

## 2015-12-26 ENCOUNTER — Ambulatory Visit (HOSPITAL_COMMUNITY)
Admission: RE | Admit: 2015-12-26 | Discharge: 2015-12-26 | Disposition: A | Discharge status: Home / Self Care | Payer: BLUE CROSS/BLUE SHIELD | Source: Ambulatory Visit | Attending: Gynecology | Admitting: Gynecology

## 2015-12-26 DIAGNOSIS — Z1231 Encounter for screening mammogram for malignant neoplasm of breast: Secondary | ICD-10-CM | POA: Insufficient documentation

## 2016-01-06 ENCOUNTER — Ambulatory Visit: Payer: BLUE CROSS/BLUE SHIELD | Attending: Gynecology | Admitting: Gynecology

## 2016-01-06 ENCOUNTER — Encounter: Payer: Self-pay | Admitting: Gynecology

## 2016-01-06 VITALS — BP 143/52 | HR 71 | Temp 97.9°F | Resp 18 | Ht 64.0 in | Wt 204.0 lb

## 2016-01-06 DIAGNOSIS — C7800 Secondary malignant neoplasm of unspecified lung: Secondary | ICD-10-CM | POA: Diagnosis not present

## 2016-01-06 DIAGNOSIS — Z7982 Long term (current) use of aspirin: Secondary | ICD-10-CM | POA: Diagnosis not present

## 2016-01-06 DIAGNOSIS — C541 Malignant neoplasm of endometrium: Secondary | ICD-10-CM | POA: Diagnosis present

## 2016-01-06 DIAGNOSIS — Z8542 Personal history of malignant neoplasm of other parts of uterus: Secondary | ICD-10-CM

## 2016-01-06 DIAGNOSIS — Z923 Personal history of irradiation: Secondary | ICD-10-CM | POA: Diagnosis not present

## 2016-01-06 DIAGNOSIS — E669 Obesity, unspecified: Secondary | ICD-10-CM | POA: Diagnosis not present

## 2016-01-06 DIAGNOSIS — I10 Essential (primary) hypertension: Secondary | ICD-10-CM | POA: Insufficient documentation

## 2016-01-06 DIAGNOSIS — E119 Type 2 diabetes mellitus without complications: Secondary | ICD-10-CM | POA: Insufficient documentation

## 2016-01-06 NOTE — Progress Notes (Signed)
Consult Note: Gyn-Onc   [name] 65 y.o. female  No chief complaint on file.   Assessment: Recurrent endometrial cancer currently clinically free of disease.  Plan: She return to see [place] in one year.  She's encouraged to continue weight loss.   Interval History:  The patient returns today as previously scheduled for followup. She has no other GI GU or pelvic symptoms. Functional status is excellent. She is up-to-date with mammograms.  HPI  In 2003 the patient underwent abdominal hysterectomy for a stage IB grade 1 endometrial carcinoma. With good prognostic features she had no subsequent therapy. 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 These were biopsied. She was then treated with 6 cycles of carboplatin and Taxol had a partial response. Subsequently she was placed on Megace 40 mg 3 times a day. She then had a complete response with resolution of all pulmonary nodules. She's continued to take Megace for subsequent 10 years. She also received pelvic radiation therapy to treat the vaginal recurrence.  Review of Systems:10 point review of systems is negative as noted above.   Vitals: Blood pressure 143/52, pulse 71, temperature 97.9 F (36.6 C), temperature source Oral, resp. rate 18, height '5\' 4"'$  (1.626 m), weight 204 lb (92.534 kg), SpO2 100 %.  Physical Exam: General : The patient is a healthy obese woman in no acute distress.  HEENT: normocephalic, extraoccular movements normal; neck is supple without thyromegally  Lynphnodes: Supraclavicular and inguinal nodes not enlarged  Breasts: Without masses skin changes or discharge.  Abdomen: Soft, non-tender, no ascites, no organomegally, no masses, no hernias  Pelvic:  EGBUS: Normal female  Vagina: Normal, no lesions  Urethra and Bladder: Normal, non-tender  Cervix: Surgically absent  Uterus: Surgically absent  Bi-manual examination: Non-tender; no  adenxal masses or nodularity  Rectal: normal sphincter tone, no masses, no blood  Lower extremities: No edema or varicosities. Normal range of motion     Allergies  Allergen Reactions  . Compazine     Past Medical History  Diagnosis Date  . Diabetes mellitus   . Hypertension   . Obesity   . Endometrial cancer     Metastatic, Stage IB grade 1    Past Surgical History  Procedure Laterality Date  . Abdominal hysterectomy  04/2002    TAH, BSO  . Cesarean section    . Cholecystectomy      Current Outpatient Prescriptions  Medication Sig Dispense Refill  . aspirin EC 81 MG tablet Take 81 mg by mouth daily.    . cholecalciferol (VITAMIN D) 1000 UNITS tablet Take 1,000 Units by mouth daily.    Marland Kitchen CINNAMON PO Take 1,000 mg by mouth.    . Garlic 0998 MG CAPS Take by mouth.    . INVOKANA 300 MG TABS tablet   1  . Linagliptin-Metformin HCl (JENTADUETO) 2.02-999 MG TABS Take 1 each by mouth 2 (two) times daily.    Marland Kitchen lisinopril (PRINIVIL,ZESTRIL) 20 MG tablet Take 20 mg by mouth daily.    . Multiple Vitamin (MULTI VITAMIN DAILY PO) Take 1 each by mouth daily.    . Omega-3 Fatty Acids (FISH OIL) 1000 MG CAPS Take 2 each by mouth daily.    . simvastatin (ZOCOR) 20 MG tablet Take 20 mg by mouth daily.     No current facility-administered medications for this visit.    Social History   Social History  . Marital Status: Divorced  Spouse Name: N/A  . Number of Children: N/A  . Years of Education: N/A   Occupational History  . Not on file.   Social History Main Topics  . Smoking status: Never Smoker   . Smokeless tobacco: Not on file  . Alcohol Use: Not on file  . Drug Use: Not on file  . Sexual Activity: Not on file   Other Topics Concern  . Not on file   Social History Narrative    Family History  Problem Relation Age of Onset  . Ovarian cancer Mother       Alvino Chapel, MD 01/06/2016, 9:27 AM

## 2016-01-06 NOTE — Patient Instructions (Signed)
Follow up in one year with Dr Fermin Schwab and continue with yearly mammograms . Call with changes , questions or concerns.  Thank you!

## 2016-11-18 ENCOUNTER — Other Ambulatory Visit: Payer: Self-pay | Admitting: Gynecology

## 2016-11-18 DIAGNOSIS — Z1231 Encounter for screening mammogram for malignant neoplasm of breast: Secondary | ICD-10-CM

## 2016-11-30 ENCOUNTER — Telehealth: Payer: Self-pay

## 2016-11-30 NOTE — Telephone Encounter (Signed)
[id number]  PATIENT RECEIVED LETTER TO SCHEDULE TCS

## 2016-12-03 ENCOUNTER — Telehealth: Payer: Self-pay

## 2016-12-03 NOTE — Telephone Encounter (Signed)
See separate triage.  

## 2016-12-04 ENCOUNTER — Other Ambulatory Visit: Payer: Self-pay

## 2016-12-04 DIAGNOSIS — Z1211 Encounter for screening for malignant neoplasm of colon: Secondary | ICD-10-CM

## 2016-12-04 NOTE — Telephone Encounter (Signed)
SUPREP SPLIT DOSING- FULL LIQUIDS WITH BREAKFAST.  HOLD JENTADUETO on the DAY OF PROCEDURE.   Full Liquid Diet A high-calorie, high-protein supplement should be used to meet your nutritional requirements when the full liquid diet is continued for more than 2 or 3 days. If this diet is to be used for an extended period of time (more than 7 days), a multivitamin should be considered.  Breads and Starches  Allowed: None are allowed   Avoid: Any others.    Potatoes/Pasta/Rice  Allowed: ANY ITEM AS A SOUP OR SMALL PLATE OF MASHED POTATOES OR SCRAMBLED EGGS. (DO NOT EAT MORE THAN ONE SERVING ON THE DAY BEFORE COLONOSCOPY).      Vegetables  Allowed: Strained tomato or vegetable juice. Vegetables pureed in soup.   Avoid: Any others.    Fruit  Allowed: Any strained fruit juices and fruit drinks. Include 1 serving of citrus or vitamin C-enriched fruit juice daily.   Avoid: Any others.  Meat and Meat Substitutes  Allowed: Egg  Avoid: Any meat, fish, or fowl. All cheese.  Milk  Allowed: SOY Milk beverages, including milk shakes and instant breakfast mixes. Smooth yogurt.   Avoid: Any others. Avoid dairy products if not tolerated.    Soups and Combination Foods  Allowed: Broth, strained cream soups. Strained, broth-based soups.   Avoid: Any others.    Desserts and Sweets  Allowed: flavored gelatin, tapioca, ice cream, sherbet, smooth pudding, junket, fruit ices, frozen ice pops, pudding pops, frozen fudge pops, chocolate syrup. Sugar, honey, jelly, syrup.   Avoid: Any others.  Fats and Oils  Allowed: Margarine, butter, cream, sour cream, oils.   Avoid: Any others.  Beverages  Allowed: All.   Avoid: None.  Condiments  Allowed: Iodized salt, pepper, spices, flavorings. Cocoa powder.   Avoid: Any others.    SAMPLE MEAL PLAN Breakfast   cup orange juice.   1 OR 2 EGGS  1 cup milk.   1 cup beverage (coffee or tea).   Cream or sugar, if desired.     Midmorning Snack  2 SCRAMBLED OR HARD BOILED EGG   Lunch  1 cup cream soup.    cup fruit juice.   1 cup milk.    cup custard.   1 cup beverage (coffee or tea).   Cream or sugar, if desired.    Midafternoon Snack  1 cup milk shake.  Dinner  1 cup cream soup.    cup fruit juice.   1 cup MILK    cup pudding.   1 cup beverage (coffee or tea).   Cream or sugar, if desired.  Evening Snack  1 cup supplement.  To increase calories, add sugar, cream, butter, or margarine if possible. Nutritional supplements will also increase the total calories.

## 2016-12-04 NOTE — Telephone Encounter (Signed)
Gastroenterology Pre-Procedure Review  Request Date: 12/03/2016 Requesting Physician: Delman Cheadle, PA  PATIENT REVIEW QUESTIONS: The patient responded to the following health history questions as indicated:    1. Diabetes Melitis: YES 2. Joint replacements in the past 12 months: no 3. Major health problems in the past 3 months: no 4. Has an artificial valve or MVP: no 5. Has a defibrillator: no 6. Has been advised in past to take antibiotics in advance of a procedure like teeth cleaning: no 7. Family history of colon cancer: no  8. Alcohol Use: no 9. History of sleep apnea: no  10. History of coronary artery or other vascular stents placed within the last 12 months: no    MEDICATIONS & ALLERGIES:    Patient reports the following regarding taking any blood thinners:   Plavix? no Aspirin? YES Coumadin? no Brilinta? no Xarelto? no Eliquis? NO Pradaxa? no Savaysa? no Effient? no  Patient confirms/reports the following medications:  Current Outpatient Prescriptions  Medication Sig Dispense Refill  . aspirin EC 81 MG tablet Take 81 mg by mouth daily.    . cholecalciferol (VITAMIN D) 1000 UNITS tablet Take 1,000 Units by mouth daily.    Marland Kitchen CINNAMON PO Take 1,000 mg by mouth.    . Garlic 6468 MG CAPS Take by mouth.    . INVOKANA 300 MG TABS tablet every evening.   1  . Linagliptin-Metformin HCl (JENTADUETO) 2.02-999 MG TABS Take 1 each by mouth 2 (two) times daily.    Marland Kitchen lisinopril (PRINIVIL,ZESTRIL) 20 MG tablet Take 20 mg by mouth daily.    Marland Kitchen MAGNESIUM CARBONATE PO Take 1 tablet by mouth daily.    . Multiple Vitamin (MULTI VITAMIN DAILY PO) Take 1 each by mouth daily.    . NON FORMULARY Tumeric    500 mg   Once nightly    . Omega-3 Fatty Acids (FISH OIL) 1000 MG CAPS Take 2 each by mouth daily.    . simvastatin (ZOCOR) 20 MG tablet Take 20 mg by mouth daily.     No current facility-administered medications for this visit.     Patient confirms/reports the following  allergies:  Allergies  Allergen Reactions  . Compazine     No orders of the defined types were placed in this encounter.   AUTHORIZATION INFORMATION Primary Insurance:   ID #:   Group #:  Pre-Cert / Auth required: Pre-Cert / Auth #:   Secondary Insurance:   ID #:  Group #:  Pre-Cert / Auth required:  Pre-Cert / Auth #:   SCHEDULE INFORMATION: Procedure has been scheduled as follows:  Date: 12/18/2016          Time: 9:45 AM Location: [facility] Short Stay  This Gastroenterology Pre-Precedure Review Form is being routed to the following provider(s): Barney Drain, MD

## 2016-12-07 MED ORDER — NA SULFATE-K SULFATE-MG SULF 17.5-3.13-1.6 GM/177ML PO SOLN: 1.0000 | ORAL | 0 refills | Status: DC

## 2016-12-07 NOTE — Telephone Encounter (Signed)
Rx sent to the pharmacy and instructions mailed to pt.  

## 2016-12-09 NOTE — Telephone Encounter (Addendum)
PA#  For TCS  [id number]

## 2016-12-11 NOTE — Telephone Encounter (Signed)
Pt has not received the instructions in the mail. So I am faxing to her at her work # [phone].

## 2016-12-18 ENCOUNTER — Ambulatory Visit (HOSPITAL_COMMUNITY)
Admission: RE | Admit: 2016-12-18 | Discharge: 2016-12-18 | Disposition: A | Discharge status: Home / Self Care | Payer: 59 | Source: Ambulatory Visit | Attending: Gastroenterology | Admitting: Gastroenterology

## 2016-12-18 ENCOUNTER — Encounter (HOSPITAL_COMMUNITY)
Admission: RE | Disposition: A | Discharge status: Home / Self Care | Payer: Self-pay | Source: Ambulatory Visit | Attending: Gastroenterology

## 2016-12-18 ENCOUNTER — Encounter (HOSPITAL_COMMUNITY): Payer: Self-pay | Admitting: Anesthesiology

## 2016-12-18 ENCOUNTER — Encounter (HOSPITAL_COMMUNITY): Payer: Self-pay | Admitting: *Deleted

## 2016-12-18 DIAGNOSIS — Z1212 Encounter for screening for malignant neoplasm of rectum: Secondary | ICD-10-CM | POA: Diagnosis not present

## 2016-12-18 DIAGNOSIS — Z79899 Other long term (current) drug therapy: Secondary | ICD-10-CM | POA: Insufficient documentation

## 2016-12-18 DIAGNOSIS — Z7982 Long term (current) use of aspirin: Secondary | ICD-10-CM | POA: Insufficient documentation

## 2016-12-18 DIAGNOSIS — K648 Other hemorrhoids: Secondary | ICD-10-CM | POA: Diagnosis not present

## 2016-12-18 DIAGNOSIS — Z7984 Long term (current) use of oral hypoglycemic drugs: Secondary | ICD-10-CM | POA: Diagnosis not present

## 2016-12-18 DIAGNOSIS — K635 Polyp of colon: Secondary | ICD-10-CM

## 2016-12-18 DIAGNOSIS — K5730 Diverticulosis of large intestine without perforation or abscess without bleeding: Secondary | ICD-10-CM | POA: Diagnosis not present

## 2016-12-18 DIAGNOSIS — I10 Essential (primary) hypertension: Secondary | ICD-10-CM | POA: Diagnosis not present

## 2016-12-18 DIAGNOSIS — E119 Type 2 diabetes mellitus without complications: Secondary | ICD-10-CM | POA: Diagnosis not present

## 2016-12-18 DIAGNOSIS — Z1211 Encounter for screening for malignant neoplasm of colon: Secondary | ICD-10-CM | POA: Diagnosis not present

## 2016-12-18 DIAGNOSIS — K5790 Diverticulosis of intestine, part unspecified, without perforation or abscess without bleeding: Secondary | ICD-10-CM

## 2016-12-18 HISTORY — DX: Other hemorrhoids: K64.8

## 2016-12-18 HISTORY — DX: Nausea with vomiting, unspecified: R11.2

## 2016-12-18 HISTORY — PX: COLONOSCOPY: SHX5424

## 2016-12-18 HISTORY — DX: Diverticulosis of intestine, part unspecified, without perforation or abscess without bleeding: K57.90

## 2016-12-18 HISTORY — DX: Other specified postprocedural states: Z98.890

## 2016-12-18 LAB — GLUCOSE, CAPILLARY: Glucose-Capillary: 170 mg/dL — ABNORMAL HIGH (ref 65–99)

## 2016-12-18 SURGERY — COLONOSCOPY
Anesthesia: Moderate Sedation

## 2016-12-18 MED ORDER — FENTANYL CITRATE (PF) 100 MCG/2ML IJ SOLN
INTRAMUSCULAR | Status: DC
Start: 2016-12-18 — End: 2016-12-18
  Filled 2016-12-18: qty 2

## 2016-12-18 MED ORDER — FENTANYL CITRATE (PF) 100 MCG/2ML IJ SOLN
INTRAMUSCULAR | Status: DC | PRN
  Administered 2016-12-18 (×3): 25 ug via INTRAVENOUS
  Administered 2016-12-18: 50 ug via INTRAVENOUS

## 2016-12-18 MED ORDER — MIDAZOLAM HCL 5 MG/5ML IJ SOLN
INTRAMUSCULAR | Status: AC
  Filled 2016-12-18: qty 10

## 2016-12-18 MED ORDER — FENTANYL CITRATE (PF) 100 MCG/2ML IJ SOLN
INTRAMUSCULAR | Status: AC
  Filled 2016-12-18: qty 2

## 2016-12-18 MED ORDER — MIDAZOLAM HCL 5 MG/5ML IJ SOLN
INTRAMUSCULAR | Status: DC | PRN
  Administered 2016-12-18 (×4): 2 mg via INTRAVENOUS

## 2016-12-18 MED ORDER — ONDANSETRON HCL 4 MG/2ML IJ SOLN
INTRAMUSCULAR | Status: DC | PRN
  Administered 2016-12-18: 4 mg via INTRAVENOUS

## 2016-12-18 MED ORDER — MEPERIDINE HCL 100 MG/ML IJ SOLN
INTRAMUSCULAR | Status: AC
  Filled 2016-12-18: qty 2

## 2016-12-18 MED ORDER — SODIUM CHLORIDE 0.9 % IV SOLN
INTRAVENOUS | Status: DC
  Administered 2016-12-18: 1000 mL via INTRAVENOUS

## 2016-12-18 MED ORDER — ONDANSETRON HCL 4 MG/2ML IJ SOLN
INTRAMUSCULAR | Status: AC
  Filled 2016-12-18: qty 2

## 2016-12-18 NOTE — Op Note (Signed)
[facility] Patient Name: [name] Procedure Date: 12/18/2016 9:35 AM MRN: [id number] Date of Birth: [birth year] Attending MD: Barney Drain , MD CSN: [id number] Age: 66 Admit Type: Outpatient Procedure:                Colonoscopy WITH SNARE POLYPECTOMY Indications:              Screening for colorectal malignant neoplasm Providers:                Barney Drain, MD, Lurline Del, RN, Purcell Nails. [place],                            [employer] Referring MD:             Jake Samples PA Medicines:                Ondansetron 4 mg IV, Fentanyl 125 micrograms IV,                            Midazolam 8 mg IV Complications:            No immediate complications. Estimated Blood Loss:     Estimated blood loss: none. Procedure:                Pre-Anesthesia Assessment:                           - Prior to the procedure, a History and Physical                            was performed, and patient medications and                            allergies were reviewed. The patient's tolerance of                            previous anesthesia was also reviewed. The risks                            and benefits of the procedure and the sedation                            options and risks were discussed with the patient.                            All questions were answered, and informed consent                            was obtained. Prior Anticoagulants: The patient has                            taken aspirin, last dose was 1 day prior to                            procedure. ASA Grade Assessment: II - A patient  with mild systemic disease. After reviewing the                            risks and benefits, the patient was deemed in                            satisfactory condition to undergo the procedure.                            After obtaining informed consent, the colonoscope                            was passed under direct vision. Throughout the                procedure, the patient's blood pressure, pulse, and                            oxygen saturations were monitored continuously. The                            EC-[id number] ([id number]) scope was introduced through                            the anus and advanced to the the cecum, identified                            by appendiceal orifice and ileocecal valve. The                            colonoscopy was somewhat difficult due to a                            tortuous colon. Successful completion of the                            procedure was aided by increasing the dose of                            sedation medication, straightening and shortening                            the scope to obtain bowel loop reduction and                            COLOWRAP. The patient tolerated the procedure                            fairly well. The quality of the bowel preparation                            was excellent. The ileocecal valve, appendiceal                            orifice, and rectum  were photographed. Scope In: 10:12:11 AM Scope Out: 10:28:23 AM Scope Withdrawal Time: 0 hours 13 minutes 0 seconds  Total Procedure Duration: 0 hours 16 minutes 12 seconds  Findings:      A 6 mm polyp was found in the hepatic flexure. The polyp was sessile.       The polyp was removed with a hot snare. Resection and retrieval were       complete.      A few small-mouthed diverticula were found in the recto-sigmoid colon       and sigmoid colon.      Internal hemorrhoids were found during retroflexion. The hemorrhoids       were small. Impression:               - One 6 mm polyp at the hepatic flexure, removed                            with a hot snare. Resected and retrieved.                           - MILD Diverticulosis in the recto-sigmoid colon                            and in the sigmoid colon.                           - Internal hemorrhoids. Moderate Sedation:      Moderate  (conscious) sedation was administered by the endoscopy nurse       and supervised by the endoscopist. The following parameters were       monitored: oxygen saturation, heart rate, blood pressure, and response       to care. Total physician intraservice time was 32 minutes. Recommendation:           - High fiber diet. LOSE WEIGHT.                           - Continue present medications.                           - Await pathology results.                           - Repeat colonoscopy in 5-10 years for surveillance.                           - Patient has a contact number available for                            emergencies. The signs and symptoms of potential                            delayed complications were discussed with the                            patient. Return to normal activities tomorrow.  Written discharge instructions were provided to the                            patient. Procedure Code(s):        --- Professional ---                           [phone], Colonoscopy, flexible; with removal of                            tumor(s), polyp(s), or other lesion(s) by snare                            technique                           99152, Moderate sedation services provided by the                            same physician or other qualified health care                            professional performing the diagnostic or                            therapeutic service that the sedation supports,                            requiring the presence of an independent trained                            observer to assist in the monitoring of the                            patient's level of consciousness and physiological                            status; initial 15 minutes of intraservice time,                            patient age 19 years or older                           [phone], Moderate sedation services; each additional                            15  minutes intraservice time Diagnosis Code(s):        --- Professional ---                           Z12.11, Encounter for screening for malignant                            neoplasm of colon  D12.3, Benign neoplasm of transverse colon (hepatic                            flexure or splenic flexure)                           K64.8, Other hemorrhoids                           K57.30, Diverticulosis of large intestine without                            perforation or abscess without bleeding CPT copyright 2016 American Medical Association. All rights reserved. The codes documented in this report are preliminary and upon coder review may  be revised to meet current compliance requirements. Barney Drain, MD Barney Drain, MD 12/18/2016 10:47:17 AM This report has been signed electronically. Number of Addenda: 0

## 2016-12-18 NOTE — H&P (Signed)
Primary Care Physician:  Jana Half Primary Gastroenterologist:  Dr. Oneida Alar  Pre-Procedure History & Physical: HPI:  [name] is a 66 y.o. female here for [place].  Past Medical History:  Diagnosis Date  . Diabetes mellitus   . Endometrial cancer ([place])    Metastatic, Stage IB grade 1  . Hypertension   . Obesity   . PONV (postoperative nausea and vomiting)    Past Surgical History:  Procedure Laterality Date  . ABDOMINAL HYSTERECTOMY  04/2002   TAH, BSO  . BREAST SURGERY     reduction  . CESAREAN SECTION    . CHOLECYSTECTOMY    . thoracic lung biopsy Left   . TONSILLECTOMY      Prior to Admission medications   Medication Sig Start Date End Date Taking? Authorizing Provider  aspirin EC 81 MG tablet Take 81 mg by mouth daily.   Yes Historical Provider, MD  Calcium Carb-Cholecalciferol (CALCIUM 500+D PO) Take 1 tablet by mouth daily.   Yes Historical Provider, MD  cholecalciferol (VITAMIN D) 1000 UNITS tablet Take 1,000 Units by mouth daily.   Yes Historical Provider, MD  CINNAMON PO Take 1,000 mg by mouth daily.    Yes Historical Provider, MD  Garlic 7425 MG CAPS Take 1 capsule by mouth daily.    Yes Historical Provider, MD  INVOKANA 300 MG TABS tablet Take 300 mg by mouth every evening.  01/09/15  Yes Historical Provider, MD  Linagliptin-Metformin HCl (JENTADUETO) 2.02-999 MG TABS Take 1 each by mouth 2 (two) times daily.   Yes Historical Provider, MD  lisinopril (PRINIVIL,ZESTRIL) 20 MG tablet Take 20 mg by mouth daily.   Yes Historical Provider, MD  Multiple Vitamin (MULTI VITAMIN DAILY PO) Take 1 each by mouth daily.   Yes Historical Provider, MD  Omega-3 Fatty Acids (FISH OIL) 1000 MG CAPS Take 1 each by mouth daily.    Yes Historical Provider, MD  simvastatin (ZOCOR) 20 MG tablet Take 20 mg by mouth daily.   Yes Historical Provider, MD  Turmeric 500 MG CAPS Take 1 capsule by mouth daily.    Yes Historical Provider, MD  vitamin C (ASCORBIC  ACID) 500 MG tablet Take 500 mg by mouth daily.   Yes Historical Provider, MD    Allergies as of 12/04/2016 - Review Complete 12/03/2016  Allergen Reaction Noted  . Compazine  12/25/2010    Family History  Problem Relation Age of Onset  . Ovarian cancer Mother   . Heart Problems Father   . Coronary artery disease Brother     Social History   Social History  . Marital status: Divorced    Spouse name: N/A  . Number of children: N/A  . Years of education: N/A   Occupational History  . Not on file.   Social History Main Topics  . Smoking status: Never Smoker  . Smokeless tobacco: Never Used  . Alcohol use No  . Drug use: No  . Sexual activity: Not on file   Other Topics Concern  . Not on file   Social History Narrative  . No narrative on file    Review of Systems: See HPI, otherwise negative ROS   Physical Exam: BP (!) 151/70   Pulse 96   Temp 98 F (36.7 C)   Resp 16   Ht '5\' 4"'$  (1.626 m)   Wt 206 lb (93.4 kg)   SpO2 95%   BMI 35.36 kg/m  General:   Alert,  pleasant and cooperative  in NAD Head:  Normocephalic and atraumatic. Neck:  Supple; Lungs:  Clear throughout to auscultation.    Heart:  Regular rate and rhythm. Abdomen:  Soft, nontender and nondistended. Normal bowel sounds, without guarding, and without rebound.   Neurologic:  Alert and  oriented x4;  grossly normal neurologically.  Impression/Plan:    SCREENING  Plan:  1. TCS TODAY. DISCUSSED PROCEDURE, BENEFITS, & RISKS: < 1% chance of medication reaction, bleeding, perforation, or rupture of spleen/liver.

## 2016-12-18 NOTE — Discharge Instructions (Signed)
You had 1 polyp removed. You have small internal hemorrhoids and diverticulosis IN YOUR LEFT COLON.    DRINK WATER TO KEEP YOUR URINE LIGHT YELLOW.  CONTINUE YOUR WEIGHT LOSS EFFORTS.  WHILE I DO NOT WANT TO ALARM YOU, YOUR BODY MASS INDEX IS OVER 30 WHICH MEANS YOU ARE OBESE. OBESITY activates cancer genes and IS ASSOCIATED WITH AN INCREASE RISK FOR ALL CANCERS, INCLUDING ESOPHAGEAL AND COLON CANCER.  FOLLOW A HIGH FIBER DIET. AVOID ITEMS THAT CAUSE BLOATING. SEE INFO BELOW.  YOUR BIOPSY RESULTS WILL BE AVAILABLE IN MY CHART AFTER MAR 13 AND MY OFFICE WILL CONTACT YOU IN 10-14 DAYS WITH YOUR RESULTS.   Next colonoscopy in 5-10 years.   Colonoscopy Care After Read the instructions outlined below and refer to this sheet in the next week. These discharge instructions provide you with general information on caring for yourself after you leave the hospital. While your treatment has been planned according to the most current medical practices available, unavoidable complications occasionally occur. If you have any problems or questions after discharge, call DR. Nikolaj Geraghty, [phone].  ACTIVITY  You may resume your regular activity, but move at a slower pace for the next 24 hours.   Take frequent rest periods for the next 24 hours.   Walking will help get rid of the air and reduce the bloated feeling in your belly (abdomen).   No driving for 24 hours (because of the medicine (anesthesia) used during the test).   You may shower.   Do not sign any important legal documents or operate any machinery for 24 hours (because of the anesthesia used during the test).    NUTRITION  Drink plenty of fluids.   You may resume your normal diet as instructed by your doctor.   Begin with a light meal and progress to your normal diet. Heavy or fried foods are harder to digest and may make you feel sick to your stomach (nauseated).   Avoid alcoholic beverages for 24 hours or as instructed.     MEDICATIONS  You may resume your normal medications.   WHAT YOU CAN EXPECT TODAY  Some feelings of bloating in the abdomen.   Passage of more gas than usual.   Spotting of blood in your stool or on the toilet paper  .  IF YOU HAD POLYPS REMOVED DURING THE COLONOSCOPY:  Eat a soft diet IF YOU HAVE NAUSEA, BLOATING, ABDOMINAL PAIN, OR VOMITING.    FINDING OUT THE RESULTS OF YOUR TEST Not all test results are available during your visit. DR. Oneida Alar WILL CALL YOU WITHIN 14 DAYS OF YOUR PROCEDUE WITH YOUR RESULTS. Do not assume everything is normal if you have not heard from DR. Venna Berberich, CALL HER OFFICE AT [phone].  SEEK IMMEDIATE MEDICAL ATTENTION AND CALL THE OFFICE: [phone] IF:  You have more than a spotting of blood in your stool.   Your belly is swollen (abdominal distention).   You are nauseated or vomiting.   You have a temperature over 101F.   You have abdominal pain or discomfort that is severe or gets worse throughout the day.  Polyps, Colon  A polyp is extra tissue that grows inside your body. Colon polyps grow in the large intestine. The large intestine, also called the colon, is part of your digestive system. It is a long, hollow tube at the end of your digestive tract where your body makes and stores stool. Most polyps are not dangerous. They are benign. This means they are not  cancerous. But over time, some types of polyps can turn into cancer. Polyps that are smaller than a pea are usually not harmful. But larger polyps could someday become or may already be cancerous. To be safe, doctors remove all polyps and test them.   PREVENTION There is not one sure way to prevent polyps. You might be able to lower your risk of getting them if you:  Eat more fruits and vegetables and less fatty food.   Do not smoke.   Avoid alcohol.   Exercise every day.   Lose weight if you are overweight.   Eating more calcium and folate can also lower your risk of  getting polyps. Some foods that are rich in calcium are milk, cheese, and broccoli. Some foods that are rich in folate are chickpeas, kidney beans, and spinach.   High-Fiber Diet A high-fiber diet changes your normal diet to include more whole grains, legumes, fruits, and vegetables. Changes in the diet involve replacing refined carbohydrates with unrefined foods. The calorie level of the diet is essentially unchanged. The Dietary Reference Intake (recommended amount) for adult males is 38 grams per day. For adult females, it is 25 grams per day. Pregnant and lactating women should consume 28 grams of fiber per day. Fiber is the intact part of a plant that is not broken down during digestion. Functional fiber is fiber that has been isolated from the plant to provide a beneficial effect in the body. PURPOSE  Increase stool bulk.   Ease and regulate bowel movements.   Lower cholesterol.   REDUCE RISK OF COLON CANCER  INDICATIONS THAT YOU NEED MORE FIBER  Constipation and hemorrhoids.   Uncomplicated diverticulosis (intestine condition) and irritable bowel syndrome.   Weight management.   As a protective measure against hardening of the arteries (atherosclerosis), diabetes, and cancer.   GUIDELINES FOR INCREASING FIBER IN THE DIET  Start adding fiber to the diet slowly. A gradual increase of about 5 more grams (2 slices of whole-wheat bread, 2 servings of most fruits or vegetables, or 1 bowl of high-fiber cereal) per day is best. Too rapid an increase in fiber may result in constipation, flatulence, and bloating.   Drink enough water and fluids to keep your urine clear or pale yellow. Water, juice, or caffeine-free drinks are recommended. Not drinking enough fluid may cause constipation.   Eat a variety of high-fiber foods rather than one type of fiber.   Try to increase your intake of fiber through using high-fiber foods rather than fiber pills or supplements that contain small amounts  of fiber.   The goal is to change the types of food eaten. Do not supplement your present diet with high-fiber foods, but replace foods in your present diet.   INCLUDE A VARIETY OF FIBER SOURCES  Replace refined and processed grains with whole grains, canned fruits with fresh fruits, and incorporate other fiber sources. White rice, white breads, and most bakery goods contain little or no fiber.   Brown whole-grain rice, buckwheat oats, and many fruits and vegetables are all good sources of fiber. These include: broccoli, Brussels sprouts, cabbage, cauliflower, beets, sweet potatoes, white potatoes (skin on), carrots, tomatoes, eggplant, squash, berries, fresh fruits, and dried fruits.   Cereals appear to be the richest source of fiber. Cereal fiber is found in whole grains and bran. Bran is the fiber-rich outer coat of cereal grain, which is largely removed in refining. In whole-grain cereals, the bran remains. In breakfast cereals, the largest amount  of fiber is found in those with "bran" in their names. The fiber content is sometimes indicated on the label.   You may need to include additional fruits and vegetables each day.   In baking, for 1 cup white flour, you may use the following substitutions:   1 cup whole-wheat flour minus 2 tablespoons.   1/2 cup white flour plus 1/2 cup whole-wheat flour.   Diverticulosis Diverticulosis is a common condition that develops when small pouches (diverticula) form in the wall of the colon. The risk of diverticulosis increases with age. It happens more often in people who eat a low-fiber diet. Most individuals with diverticulosis have no symptoms. Those individuals with symptoms usually experience belly (abdominal) pain, constipation, or loose stools (diarrhea).  HOME CARE INSTRUCTIONS  Increase the amount of fiber in your diet as directed by your caregiver or dietician. This may reduce symptoms of diverticulosis.   Drink at least 6 to 8 glasses of  water each day to prevent constipation.   Try not to strain when you have a bowel movement.   Avoiding nuts and seeds to prevent complications is NOT NECESSARY.   FOODS HAVING HIGH FIBER CONTENT INCLUDE:  Fruits. Apple, peach, pear, tangerine, raisins, prunes.   Vegetables. Brussels sprouts, asparagus, broccoli, cabbage, carrot, cauliflower, romaine lettuce, spinach, summer squash, tomato, winter squash, zucchini.   Starchy Vegetables. Baked beans, kidney beans, lima beans, split peas, lentils, potatoes (with skin).   Grains. Whole wheat bread, brown rice, bran flake cereal, plain oatmeal, white rice, shredded wheat, bran muffins.   SEEK IMMEDIATE MEDICAL CARE IF:  You develop increasing pain or severe bloating.   You have an oral temperature above 101F.   You develop vomiting or bowel movements that are bloody or black.   Hemorrhoids Hemorrhoids are dilated (enlarged) veins around the rectum. Sometimes clots will form in the veins. This makes them swollen and painful. These are called thrombosed hemorrhoids. Causes of hemorrhoids include:  Constipation.   Straining to have a bowel movement.   HEAVY LIFTING  HOME CARE INSTRUCTIONS  Eat a well balanced diet and drink 6 to 8 glasses of water every day to avoid constipation. You may also use a bulk laxative.   Avoid straining to have bowel movements.   Keep anal area dry and clean.   Do not use a donut shaped pillow or sit on the toilet for long periods. This increases blood pooling and pain.   Move your bowels when your body has the urge; this will require less straining and will decrease pain and pressure.

## 2016-12-22 ENCOUNTER — Encounter (HOSPITAL_COMMUNITY): Payer: Self-pay | Admitting: Gastroenterology

## 2016-12-28 ENCOUNTER — Ambulatory Visit (HOSPITAL_COMMUNITY)
Admission: RE | Admit: 2016-12-28 | Discharge: 2016-12-28 | Disposition: A | Discharge status: Home / Self Care | Payer: 59 | Source: Ambulatory Visit | Attending: Gynecology | Admitting: Gynecology

## 2016-12-28 DIAGNOSIS — Z1231 Encounter for screening mammogram for malignant neoplasm of breast: Secondary | ICD-10-CM | POA: Insufficient documentation

## 2017-01-04 ENCOUNTER — Telehealth: Payer: Self-pay | Admitting: General Practice

## 2017-01-04 NOTE — Telephone Encounter (Addendum)
Called patient TO DISCUSS RESULTS. She had a polypoid lesion, removed and it was benign. FOLLOW A HIGH FIBER DIET. NEXT TCS in 10 years BECAUSE SHE HAS NO FAMILY Hx COLON CA.

## 2017-01-04 NOTE — Telephone Encounter (Signed)
Patient called and wanted to know results from her tcs.  I spoke with Roseanne Kaufman and she said I can let her know there is no cancer.  Routing to Dr. Oneida Alar for official results

## 2017-01-05 NOTE — Telephone Encounter (Signed)
LMOM to call back

## 2017-01-05 NOTE — Telephone Encounter (Signed)
Reminder in epic °

## 2017-01-07 NOTE — Telephone Encounter (Signed)
Tried to call with no answer  

## 2017-01-08 ENCOUNTER — Ambulatory Visit: Payer: 59 | Attending: Gynecology | Admitting: Gynecology

## 2017-01-08 ENCOUNTER — Encounter: Payer: Self-pay | Admitting: Gynecology

## 2017-01-08 VITALS — BP 138/70 | HR 89 | Temp 97.2°F | Resp 17

## 2017-01-08 DIAGNOSIS — Z8249 Family history of ischemic heart disease and other diseases of the circulatory system: Secondary | ICD-10-CM | POA: Diagnosis not present

## 2017-01-08 DIAGNOSIS — Z8542 Personal history of malignant neoplasm of other parts of uterus: Secondary | ICD-10-CM | POA: Diagnosis not present

## 2017-01-08 DIAGNOSIS — Z8041 Family history of malignant neoplasm of ovary: Secondary | ICD-10-CM | POA: Insufficient documentation

## 2017-01-08 DIAGNOSIS — Z90710 Acquired absence of both cervix and uterus: Secondary | ICD-10-CM | POA: Diagnosis not present

## 2017-01-08 DIAGNOSIS — Z9049 Acquired absence of other specified parts of digestive tract: Secondary | ICD-10-CM | POA: Diagnosis not present

## 2017-01-08 DIAGNOSIS — I10 Essential (primary) hypertension: Secondary | ICD-10-CM | POA: Insufficient documentation

## 2017-01-08 DIAGNOSIS — C541 Malignant neoplasm of endometrium: Secondary | ICD-10-CM

## 2017-01-08 DIAGNOSIS — Z90722 Acquired absence of ovaries, bilateral: Secondary | ICD-10-CM | POA: Diagnosis not present

## 2017-01-08 DIAGNOSIS — E669 Obesity, unspecified: Secondary | ICD-10-CM | POA: Diagnosis not present

## 2017-01-08 DIAGNOSIS — Z888 Allergy status to other drugs, medicaments and biological substances status: Secondary | ICD-10-CM | POA: Diagnosis not present

## 2017-01-08 DIAGNOSIS — Z7982 Long term (current) use of aspirin: Secondary | ICD-10-CM | POA: Diagnosis not present

## 2017-01-08 DIAGNOSIS — Z85118 Personal history of other malignant neoplasm of bronchus and lung: Secondary | ICD-10-CM | POA: Insufficient documentation

## 2017-01-08 DIAGNOSIS — E119 Type 2 diabetes mellitus without complications: Secondary | ICD-10-CM | POA: Insufficient documentation

## 2017-01-08 NOTE — Patient Instructions (Signed)
Follow up in 1 year with Dr. Fermin Schwab. Please call our office in Nov or Dec 2018 to schedule march 2019 appt.

## 2017-01-08 NOTE — Progress Notes (Signed)
Consult Note: Gyn-Onc   [name] 66 y.o. female  Chief Complaint  Patient presents with  . endometrial cancer    Assessment: Recurrent endometrial cancer currently clinically free of disease.  Plan: She return to see [place] in one year.  She's encouraged to continue weight loss.   Interval History:  The patient returns today as previously scheduled for followup. She has no other GI GU or pelvic symptoms. Functional status is excellent. She is up-to-date with mammogramsAnd colonoscopy.Marland Kitchen  HPI  In 2003 the patient underwent abdominal hysterectomy for a stage IB grade 1 endometrial carcinoma. With good prognostic features she had no subsequent therapy. 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 These were biopsied. She was then treated with 6 cycles of carboplatin and Taxol had a partial response. Subsequently she was placed on Megace 40 mg 3 times a day. She then had a complete response with resolution of all pulmonary nodules. She's continued to take Megace for subsequent 10 years. She also received pelvic radiation therapy to treat the vaginal recurrence.  Review of Systems:10 point review of systems is negative as noted above.   Vitals: There were no vitals taken for this visit.  Physical Exam: General : The patient is a healthy obese woman in no acute distress.  HEENT: normocephalic, extraoccular movements normal; neck is supple without thyromegally  Lynphnodes: Supraclavicular and inguinal nodes not enlarged Breasts: Without masses skin changes or discharge.  Abdomen: Soft, non-tender, no ascites, no organomegally, no masses, no hernias  Pelvic:  EGBUS: Normal female  Vagina: Normal, no lesions  Urethra and Bladder: Normal, non-tender  Cervix: Surgically absent  Uterus: Surgically absent  Bi-manual examination: Non-tender; no adenxal masses or nodularity  Rectal: normal sphincter tone, no masses, no  blood  Lower extremities: No edema or varicosities. Normal range of motion     Allergies  Allergen Reactions  . Compazine Other (See Comments)    Pt does not want med due to strange side effects     Past Medical History:  Diagnosis Date  . Diabetes mellitus   . Endometrial cancer ([place])    Metastatic, Stage IB grade 1  . Hypertension   . Obesity   . PONV (postoperative nausea and vomiting)     Past Surgical History:  Procedure Laterality Date  . ABDOMINAL HYSTERECTOMY  04/2002   TAH, BSO  . BREAST SURGERY     reduction  . CESAREAN SECTION    . CHOLECYSTECTOMY    . COLONOSCOPY N/A 12/18/2016   Procedure: COLONOSCOPY;  Surgeon: Danie Binder, MD;  [facility];  Service: Endoscopy;  Laterality: N/A;  9:45 AM  . thoracic lung biopsy Left   . TONSILLECTOMY      Current Outpatient Prescriptions  Medication Sig Dispense Refill  . aspirin EC 81 MG tablet Take 81 mg by mouth daily.    . Calcium Carb-Cholecalciferol (CALCIUM 500+D PO) Take 1 tablet by mouth daily.    . cholecalciferol (VITAMIN D) 1000 UNITS tablet Take 1,000 Units by mouth daily.    Marland Kitchen CINNAMON PO Take 1,000 mg by mouth daily.     . Garlic 4818 MG CAPS Take 1 capsule by mouth daily.     . INVOKANA 300 MG TABS tablet Take 300 mg by mouth every evening.   1  . Linagliptin-Metformin HCl (JENTADUETO) 2.02-999 MG TABS Take 1 each by mouth 2 (two) times daily.    Marland Kitchen lisinopril (  PRINIVIL,ZESTRIL) 20 MG tablet Take 20 mg by mouth daily.    . Multiple Vitamin (MULTI VITAMIN DAILY PO) Take 1 each by mouth daily.    . Omega-3 Fatty Acids (FISH OIL) 1000 MG CAPS Take 1 each by mouth daily.     . simvastatin (ZOCOR) 20 MG tablet Take 20 mg by mouth daily.    . Turmeric 500 MG CAPS Take 1 capsule by mouth daily.     . vitamin C (ASCORBIC ACID) 500 MG tablet Take 500 mg by mouth daily.     No current facility-administered medications for this visit.     Social History   Social History  . Marital status:  Divorced    Spouse name: N/A  . Number of children: N/A  . Years of education: N/A   Occupational History  . Not on file.   Social History Main Topics  . Smoking status: Never Smoker  . Smokeless tobacco: Never Used  . Alcohol use No  . Drug use: No  . Sexual activity: Not on file   Other Topics Concern  . Not on file   Social History Narrative  . No narrative on file    Family History  Problem Relation Age of Onset  . Ovarian cancer Mother   . Heart Problems Father   . Coronary artery disease Brother       [name], MD 01/08/2017, 9:19 AM

## 2017-01-14 NOTE — Telephone Encounter (Signed)
Tried to call with no answer  

## 2017-01-14 NOTE — Telephone Encounter (Signed)
Letter mailed

## 2017-10-13 ENCOUNTER — Ambulatory Visit: Payer: Medicare Other | Admitting: Radiation Oncology

## 2017-12-03 ENCOUNTER — Other Ambulatory Visit: Payer: Self-pay | Admitting: Gynecology

## 2017-12-03 DIAGNOSIS — Z1231 Encounter for screening mammogram for malignant neoplasm of breast: Secondary | ICD-10-CM

## 2017-12-29 ENCOUNTER — Ambulatory Visit (HOSPITAL_COMMUNITY): Payer: 59

## 2018-01-03 ENCOUNTER — Encounter (HOSPITAL_COMMUNITY): Payer: Self-pay

## 2018-01-03 ENCOUNTER — Ambulatory Visit (HOSPITAL_COMMUNITY)
Admission: RE | Admit: 2018-01-03 | Discharge: 2018-01-03 | Disposition: A | Discharge status: Home / Self Care | Payer: 59 | Source: Ambulatory Visit | Attending: Gynecology | Admitting: Gynecology

## 2018-01-03 DIAGNOSIS — Z1231 Encounter for screening mammogram for malignant neoplasm of breast: Secondary | ICD-10-CM

## 2018-01-07 ENCOUNTER — Ambulatory Visit: Payer: 59 | Admitting: Gynecology

## 2018-02-04 ENCOUNTER — Inpatient Hospital Stay: Payer: 59 | Attending: Gynecology | Admitting: Gynecology

## 2018-02-04 ENCOUNTER — Encounter: Payer: Self-pay | Admitting: Gynecology

## 2018-02-04 VITALS — BP 162/69 | HR 78 | Temp 98.3°F | Resp 20 | Ht 64.5 in | Wt 203.1 lb

## 2018-02-04 DIAGNOSIS — Z8542 Personal history of malignant neoplasm of other parts of uterus: Secondary | ICD-10-CM

## 2018-02-04 DIAGNOSIS — Z9221 Personal history of antineoplastic chemotherapy: Secondary | ICD-10-CM

## 2018-02-04 DIAGNOSIS — Z923 Personal history of irradiation: Secondary | ICD-10-CM

## 2018-02-04 DIAGNOSIS — C541 Malignant neoplasm of endometrium: Secondary | ICD-10-CM

## 2018-02-04 DIAGNOSIS — Z08 Encounter for follow-up examination after completed treatment for malignant neoplasm: Secondary | ICD-10-CM | POA: Insufficient documentation

## 2018-02-04 NOTE — Patient Instructions (Signed)
Dr. Fermin Schwab will see you for follow up in a year. Please call the office in January 2020 at [phone] to scheduled your appointment.

## 2018-02-04 NOTE — Progress Notes (Signed)
Consult Note: Gyn-Onc   [name] 67 y.o. female  Chief Complaint  Patient presents with  . Endometrial cancer [facility])    Assessment: Recurrent endometrial cancer currently clinically free of disease.  Plan: She return to see [place] in one year.  She's encouraged to continue weight loss.   Interval History:  The patient returns today as previously scheduled for followup. She has no other GI GU or pelvic symptoms. Functional status is excellent. She is up-to-date with mammograms and colonoscopy.   HPI  In 2003 the patient underwent abdominal hysterectomy for a stage IB grade 1 endometrial carcinoma. With good prognostic features she had no subsequent therapy. 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 These were biopsied. She was then treated with 6 cycles of carboplatin and Taxol had a partial response. Subsequently she was placed on Megace 40 mg 3 times a day. She then had a complete response with resolution of all pulmonary nodules. She's continued to take Megace for subsequent 10 years. She also received pelvic radiation therapy to treat the vaginal recurrence.  Review of Systems:10 point review of systems is negative as noted above.   Vitals: Blood pressure (!) 162/69, pulse 78, temperature 98.3 F (36.8 C), temperature source Oral, resp. rate 20, height 5' 4.5" (1.638 m), weight 203 lb 1.6 oz (92.1 kg), SpO2 97 %.  Physical Exam: General : The patient is a healthy obese woman in no acute distress.  HEENT: normocephalic, extraoccular movements normal; neck is supple without thyromegally  Lynphnodes: Supraclavicular and inguinal nodes not enlarged Breasts: Without masses skin changes or discharge.  Abdomen: Soft, non-tender, no ascites, no organomegally, no masses, no hernias  Pelvic:  EGBUS: Normal female  Vagina: Normal, no lesions  Urethra and Bladder: Normal, non-tender  Cervix: Surgically absent   Uterus: Surgically absent  Bi-manual examination: Non-tender; no adenxal masses or nodularity  Rectal: normal sphincter tone, no masses, no blood  Lower extremities: No edema or varicosities. Normal range of motion     Allergies  Allergen Reactions  . Compazine Other (See Comments)    Pt does not want med due to strange side effects     Past Medical History:  Diagnosis Date  . Diabetes mellitus   . Endometrial cancer ([place])    Metastatic, Stage IB grade 1  . Hypertension   . Obesity   . PONV (postoperative nausea and vomiting)     Past Surgical History:  Procedure Laterality Date  . ABDOMINAL HYSTERECTOMY  04/2002   TAH, BSO  . BREAST SURGERY     reduction  . CESAREAN SECTION    . CHOLECYSTECTOMY    . COLONOSCOPY N/A 12/18/2016   Procedure: COLONOSCOPY;  Surgeon: Danie Binder, MD;  [facility];  Service: Endoscopy;  Laterality: N/A;  9:45 AM  . REDUCTION MAMMAPLASTY Bilateral   . thoracic lung biopsy Left   . TONSILLECTOMY      Current Outpatient Medications  Medication Sig Dispense Refill  . aspirin EC 81 MG tablet Take 81 mg by mouth daily.    . Calcium Carb-Cholecalciferol (CALCIUM 500+D PO) Take 1 tablet by mouth daily.    . cholecalciferol (VITAMIN D) 1000 UNITS tablet Take 1,000 Units by mouth daily.    Marland Kitchen CINNAMON PO Take 1,000 mg by mouth daily.     . Garlic 1610 MG CAPS Take 1 capsule by mouth daily.     . Linagliptin-Metformin HCl (JENTADUETO) 2.02-999  MG TABS Take 1 each by mouth 2 (two) times daily.    Marland Kitchen lisinopril (PRINIVIL,ZESTRIL) 20 MG tablet Take 20 mg by mouth daily.    . Multiple Vitamin (MULTI VITAMIN DAILY PO) Take 1 each by mouth daily.    . Omega-3 Fatty Acids (FISH OIL) 1000 MG CAPS Take 1 each by mouth daily.     . simvastatin (ZOCOR) 20 MG tablet Take 20 mg by mouth daily.    . Turmeric 500 MG CAPS Take 1 capsule by mouth daily.     . vitamin C (ASCORBIC ACID) 500 MG tablet Take 500 mg by mouth daily.     No current  facility-administered medications for this visit.     Social History   Socioeconomic History  . Marital status: Divorced    Spouse name: Not on file  . Number of children: Not on file  . Years of education: Not on file  . Highest education level: Not on file  Occupational History  . Not on file  Social Needs  . Financial resource strain: Not on file  . Food insecurity:    Worry: Not on file    Inability: Not on file  . Transportation needs:    Medical: Not on file    Non-medical: Not on file  Tobacco Use  . Smoking status: Never Smoker  . Smokeless tobacco: Never Used  Substance and Sexual Activity  . Alcohol use: No  . Drug use: No  . Sexual activity: Not on file  Lifestyle  . Physical activity:    Days per week: Not on file    Minutes per session: Not on file  . Stress: Not on file  Relationships  . Social connections:    Talks on phone: Not on file    Gets together: Not on file    Attends religious service: Not on file    Active member of club or organization: Not on file    Attends meetings of clubs or organizations: Not on file    Relationship status: Not on file  . Intimate partner violence:    Fear of current or ex partner: Not on file    Emotionally abused: Not on file    Physically abused: Not on file    Forced sexual activity: Not on file  Other Topics Concern  . Not on file  Social History Narrative  . Not on file    Family History  Problem Relation Age of Onset  . Ovarian cancer Mother   . Heart Problems Father   . Coronary artery disease Brother       [name], MD 02/04/2018, 10:12 AM

## 2018-05-12 DIAGNOSIS — I700 Atherosclerosis of aorta: Secondary | ICD-10-CM

## 2018-05-12 HISTORY — DX: Atherosclerosis of aorta: I70.0

## 2018-05-26 ENCOUNTER — Encounter: Payer: Self-pay | Admitting: Obstetrics

## 2018-05-26 ENCOUNTER — Telehealth: Payer: Self-pay | Admitting: *Deleted

## 2018-05-26 ENCOUNTER — Inpatient Hospital Stay: Payer: 59 | Attending: Obstetrics | Admitting: Obstetrics

## 2018-05-26 VITALS — BP 170/84 | HR 90 | Temp 98.4°F | Resp 18 | Ht 65.0 in | Wt 200.0 lb

## 2018-05-26 DIAGNOSIS — C541 Malignant neoplasm of endometrium: Secondary | ICD-10-CM

## 2018-05-26 DIAGNOSIS — Z9221 Personal history of antineoplastic chemotherapy: Secondary | ICD-10-CM

## 2018-05-26 DIAGNOSIS — N898 Other specified noninflammatory disorders of vagina: Secondary | ICD-10-CM

## 2018-05-26 DIAGNOSIS — Z90722 Acquired absence of ovaries, bilateral: Secondary | ICD-10-CM

## 2018-05-26 DIAGNOSIS — Z923 Personal history of irradiation: Secondary | ICD-10-CM

## 2018-05-26 DIAGNOSIS — Z90710 Acquired absence of both cervix and uterus: Secondary | ICD-10-CM

## 2018-05-26 NOTE — Progress Notes (Signed)
Progress Note: Gyn-Onc   TRANIKA [name] 67 y.o. female  Chief Complaint  Patient presents with  . Endometrial cancer ([place])    [place]    Assessment: Concern for second recurrence of endometrial cancer versus other new primary  Plan:  1. We discussed my concern for another recurrence. She is in some disbelief and asks if the uterus is gone how she can have a recurrence.   We briefly discussed the possibility of treatments if this is recurrence ranging from surgical to systemic therapy. I doubt she is a candidate for repeat radiation 2. Biopsy of the vaginal lesion was done today.  We are sending MMR on the biopsy today as her original tumor is no longer available for testing. In the event she is MMR deficient/MSI high immunotherapy could be an option 3. I will order imaging to assess for metastatic disease 4. She will return pending above with either myself or Dr. Fermin Schwab.  Interval History:  The patient returns today about 4 months from her recent visit complaining of bleeding about 20-30cc on her underwear. She is now only spotting when she wipes. She denies any new strenuous activity and denies trauma per vagina.  She is no longer taking the Megace.  HPI  In 2003 the patient underwent abdominal hysterectomy for a stage IB grade 1 endometrial carcinoma. With good prognostic features she had no subsequent therapy. 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 These were biopsied. She was then treated with 6 cycles of carboplatin and Taxol had a partial response. Subsequently she was placed on Megace 40 mg 3 times a day. She then had a complete response with resolution of all pulmonary nodules. She continued to take Megace for subsequent 10 years. She also received pelvic radiation therapy to treat the vaginal recurrence.   Allergies  Allergen Reactions  . Compazine Other (See Comments)    Pt does not  want med due to strange side effects     Past Medical History:  Diagnosis Date  . Diabetes mellitus   . Endometrial cancer ([place])    Metastatic, Stage IB grade 1  . Hypertension   . Obesity   . PONV (postoperative nausea and vomiting)     Past Surgical History:  Procedure Laterality Date  . ABDOMINAL HYSTERECTOMY  04/2002   TAH  . BILATERAL OOPHORECTOMY  1998  . BREAST SURGERY     reduction  . CESAREAN SECTION    . CHOLECYSTECTOMY    . COLONOSCOPY N/A 12/18/2016   Procedure: COLONOSCOPY;  Surgeon: Danie Binder, MD;  [facility];  Service: Endoscopy;  Laterality: N/A;  9:45 AM  . REDUCTION MAMMAPLASTY Bilateral   . thoracic lung biopsy Left   . TONSILLECTOMY      Current Outpatient Medications  Medication Sig Dispense Refill  . aspirin EC 81 MG tablet Take 81 mg by mouth daily.    . Calcium Carb-Cholecalciferol (CALCIUM 500+D PO) Take 1 tablet by mouth daily.    . cholecalciferol (VITAMIN D) 1000 UNITS tablet Take 1,000 Units by mouth daily.    Marland Kitchen CINNAMON PO Take 1,000 mg by mouth daily.     . Garlic 6004 MG CAPS Take 1 capsule by mouth daily.     Marland Kitchen GLIPIZIDE XL 10 MG 24 hr tablet Take 10 mg by mouth daily with breakfast.     . lisinopril (PRINIVIL,ZESTRIL) 20 MG tablet Take 20 mg by mouth daily.    Marland Kitchen  metFORMIN (GLUCOPHAGE) 1000 MG tablet Take 1,000 mg by mouth 2 (two) times daily with a meal.     . Multiple Vitamin (MULTI VITAMIN DAILY PO) Take 1 each by mouth daily.    . Omega-3 Fatty Acids (FISH OIL) 1000 MG CAPS Take 1 each by mouth daily.     . simvastatin (ZOCOR) 20 MG tablet Take 20 mg by mouth daily.    . Turmeric 500 MG CAPS Take 1 capsule by mouth daily.     . vitamin C (ASCORBIC ACID) 500 MG tablet Take 500 mg by mouth daily.     No current facility-administered medications for this visit.     Social History   Socioeconomic History  . Marital status: Divorced    Spouse name: Not on file  . Number of children: Not on file  . Years of  education: Not on file  . Highest education level: Not on file  Occupational History  . Not on file  Social Needs  . Financial resource strain: Not on file  . Food insecurity:    Worry: Not on file    Inability: Not on file  . Transportation needs:    Medical: Not on file    Non-medical: Not on file  Tobacco Use  . Smoking status: Never Smoker  . Smokeless tobacco: Never Used  Substance and Sexual Activity  . Alcohol use: No  . Drug use: No  . Sexual activity: Not Currently  Lifestyle  . Physical activity:    Days per week: Not on file    Minutes per session: Not on file  . Stress: Not on file  Relationships  . Social connections:    Talks on phone: Not on file    Gets together: Not on file    Attends religious service: Not on file    Active member of club or organization: Not on file    Attends meetings of clubs or organizations: Not on file    Relationship status: Not on file  . Intimate partner violence:    Fear of current or ex partner: Not on file    Emotionally abused: Not on file    Physically abused: Not on file    Forced sexual activity: Not on file  Other Topics Concern  . Not on file  Social History Narrative  . Not on file    Family History  Problem Relation Age of Onset  . Ovarian cancer Mother   . Heart Problems Father   . Coronary artery disease Brother     Review of Systems  Genitourinary: Positive for vaginal bleeding.   All other systems reviewed and are negative.   Vitals:   05/26/18 1434  BP: (!) 170/84  Pulse: 90  Resp: 18  Temp: 98.4 F (36.9 C)  SpO2: 99%   Physical Exam: General : The patient is a healthy obese woman in no acute distress.  HEENT: normocephalic, extraoccular movements normal; neck is supple without thyromegally  Lynphnodes: Supraclavicular and inguinal nodes not enlarged Abdomen: Soft, non-tender, no ascites, no organomegally, no masses, no hernias  Pelvic:  EGBUS: Normal female  Vagina: + 3cm polypoid  lesion left vaginal fornix. Fleshy. Appears to have bled into itself a bit. Biopsy done Urethra and Bladder: Normal, non-tender  Cervix: Surgically absent  Uterus: Surgically absent  Bi-manual examination: Non-tender; no adenxal masses or nodularity, vaginal mass as noted. Rectal: normal sphincter tone,  No mass impingement or parametrial disease. Lower extremities: No edema or varicosities. Normal range  of motion     Isabel Caprice, MD 05/29/2018, 2:33 PM   Cc:  Scherrie Bateman (PCP)

## 2018-05-26 NOTE — Telephone Encounter (Signed)
Patient called stating "I started bleeding yesterday. Yesterday was a menstrual type bleeding, it was bright red blood. Today is just spotting, it is pink. I'm not having any pain or cramping. I'm also raw down there. I have not done anything new like heavy lifting, execrise or intercourse. The only thing I can think of but doubt that is what is causing this is my PCP changed my diabetes medicines." Per Lenna Sciara [facility] patient scheduled to see Dr. Gerarda Fraction this afternoon.

## 2018-05-26 NOTE — Patient Instructions (Signed)
1) Plan to meet with Dr. Fermin Schwab in the office after your CT scan to discuss biopsy, scan, and next steps.  2) You may have spotting after the biopsy today.  Please call our office for heavy bleeding.

## 2018-05-29 ENCOUNTER — Encounter: Payer: Self-pay | Admitting: Obstetrics

## 2018-05-29 NOTE — Progress Notes (Signed)
Procedure Note  Vaginal Biopsy  The procedure was reviewed with the patient and permission given for biopsy. The vagina was prepped with betadine. A Tischler was used to biopsy. The bleeding was controlled with Monsel's. The patient tolerated the procedure well.

## 2018-06-01 ENCOUNTER — Telehealth: Payer: Self-pay | Admitting: Gynecologic Oncology

## 2018-06-01 ENCOUNTER — Ambulatory Visit (HOSPITAL_COMMUNITY): Payer: 59

## 2018-06-01 NOTE — Telephone Encounter (Signed)
Attempted to call patient with biopsy results.  Left message asking her to please call the office.

## 2018-06-01 NOTE — Telephone Encounter (Signed)
Patient informed of biopsy results and advised to continue as planned with the CT scan then follow up with Dr. Fermin Schwab.  Advised even though the biopsy did not show cancer, that does not 100% mean there is nothing there of concern so they may decide to get more tissue via biopsy for evaluation.  Verbalizing understanding.  Advised to call for any needs.

## 2018-06-07 ENCOUNTER — Encounter (HOSPITAL_COMMUNITY): Payer: Self-pay

## 2018-06-07 ENCOUNTER — Inpatient Hospital Stay (HOSPITAL_BASED_OUTPATIENT_CLINIC_OR_DEPARTMENT_OTHER): Payer: 59 | Admitting: Gynecology

## 2018-06-07 ENCOUNTER — Telehealth: Payer: Self-pay

## 2018-06-07 ENCOUNTER — Ambulatory Visit (HOSPITAL_COMMUNITY)
Admission: RE | Admit: 2018-06-07 | Discharge: 2018-06-07 | Disposition: A | Discharge status: Home / Self Care | Payer: 59 | Source: Ambulatory Visit | Attending: Obstetrics | Admitting: Obstetrics

## 2018-06-07 ENCOUNTER — Encounter: Payer: Self-pay | Admitting: Gynecology

## 2018-06-07 VITALS — BP 162/68 | HR 87 | Temp 98.9°F | Resp 20 | Ht 64.5 in | Wt 198.8 lb

## 2018-06-07 DIAGNOSIS — I700 Atherosclerosis of aorta: Secondary | ICD-10-CM | POA: Diagnosis not present

## 2018-06-07 DIAGNOSIS — C541 Malignant neoplasm of endometrium: Secondary | ICD-10-CM

## 2018-06-07 MED ORDER — MEGESTROL ACETATE 40 MG PO TABS: 40.0000 mg | ORAL_TABLET | Freq: Three times a day (TID) | ORAL | 3 refills | Status: DC

## 2018-06-07 MED ORDER — IOPAMIDOL (ISOVUE-300) INJECTION 61%
30.0000 mL | Freq: Once | INTRAVENOUS | Status: AC | PRN
  Administered 2018-06-07: 30 mL via ORAL

## 2018-06-07 MED ORDER — IOHEXOL 300 MG/ML  SOLN
100.0000 mL | Freq: Once | INTRAMUSCULAR | Status: AC | PRN
  Administered 2018-06-07: 100 mL via INTRAVENOUS

## 2018-06-07 MED ORDER — IOPAMIDOL (ISOVUE-300) INJECTION 61%
INTRAVENOUS | Status: AC
  Filled 2018-06-07: qty 30

## 2018-06-07 NOTE — Telephone Encounter (Signed)
Told [name] that the CT scan showed no evidence of recurrent or metastatic cancer per Dr. Fermin Schwab.  Keep appointment on 06-21-18 as scheduled. Pt verbalized understanding.

## 2018-06-07 NOTE — Progress Notes (Signed)
Consult Note: Gyn-Onc   [name] 67 y.o. female  Chief Complaint  Patient presents with  . Endometrial cancer ([place])    Assessment and plan: New lesion in the upper vagina suspicious for recurrent cancer.  Recent biopsy is inconclusive.  Repeat biopsy is recommended.  The patient wishes to defer another biopsy until I return on September 10.  She will contact us if she has any increased bleeding or any other problems in the meantime.     Interval History:  The patient returns today to review her recent biopsy.  Unfortunately the biopsy shows only ulcer with necrosis and hemorrhage no evidence of malignancy is noted.  The patient has had a small amount of vaginal bleeding over the last few days.  She denies any pelvic pain or any vaginal discharge.   HPI  In 2003 the patient underwent abdominal hysterectomy for a stage IB grade 1 endometrial carcinoma. With good prognostic features she had no subsequent therapy. 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 These were biopsied. She was then treated with 6 cycles of carboplatin and Taxol had a partial response. Subsequently she was placed on Megace 40 mg 3 times a day. She then had a complete response with resolution of all pulmonary nodules. She's continued to take Megace for subsequent 10 years. She also received pelvic radiation therapy to treat the vaginal recurrence.  Review of Systems:10 point review of systems is negative as noted above.   Vitals: Blood pressure (!) 162/68, pulse 87, temperature 98.9 F (37.2 C), temperature source Oral, resp. rate 20, height 5' 4.5" (1.638 m), weight 198 lb 12.8 oz (90.2 kg), SpO2 100 %.  Physical Exam: General : The patient is a healthy obese woman in no acute distress.  HEENT: normocephalic, extraoccular movements normal; neck is supple without thyromegally  Lynphnodes: Supraclavicular and inguinal nodes not  enlarged Breasts: Without masses skin changes or discharge.  Abdomen: Soft, non-tender, no ascites, no organomegally, no masses, no hernias  Pelvic:  EGBUS: Normal female  Vagina: Normal, there is approximately 3 cm polypoid lesion in the left upper vaginal fornix. Urethra and Bladder: Normal, non-tender  Cervix: Surgically absent  Uterus: Surgically absent  Bi-manual examination: Non-tender; no adenxal masses or nodularity  Rectal: normal sphincter tone, no masses, no blood  Lower extremities: No edema or varicosities. Normal range of motion     Allergies  Allergen Reactions  . Compazine Other (See Comments)    Pt does not want med due to strange side effects     Past Medical History:  Diagnosis Date  . Diabetes mellitus   . Endometrial cancer ([place])    Metastatic, Stage IB grade 1  . Hypertension   . Obesity   . PONV (postoperative nausea and vomiting)     Past Surgical History:  Procedure Laterality Date  . ABDOMINAL HYSTERECTOMY  04/2002   TAH  . BILATERAL OOPHORECTOMY  1998  . BREAST SURGERY     reduction  . CESAREAN SECTION    . CHOLECYSTECTOMY    . COLONOSCOPY N/A 12/18/2016   Procedure: COLONOSCOPY;  Surgeon: Danie Binder, MD;  Location[facility];  Service: Endoscopy;  Laterality: N/A;  9:45 AM  . REDUCTION MAMMAPLASTY Bilateral   . thoracic lung biopsy Left   . TONSILLECTOMY      Current Outpatient Medications  Medication Sig Dispense Refill  . aspirin EC 81 MG tablet Take 81 mg by  mouth daily.    . Calcium Carb-Cholecalciferol (CALCIUM 500+D PO) Take 1 tablet by mouth daily.    . cholecalciferol (VITAMIN D) 1000 UNITS tablet Take 1,000 Units by mouth daily.    Marland Kitchen CINNAMON PO Take 1,000 mg by mouth daily.     . Garlic 1443 MG CAPS Take 1 capsule by mouth daily.     Marland Kitchen GLIPIZIDE XL 10 MG 24 hr tablet Take 10 mg by mouth daily with breakfast.     . lisinopril (PRINIVIL,ZESTRIL) 20 MG tablet Take 20 mg by mouth daily.    . metFORMIN (GLUCOPHAGE) 1000 MG  tablet Take 1,000 mg by mouth 2 (two) times daily with a meal.     . Multiple Vitamin (MULTI VITAMIN DAILY PO) Take 1 each by mouth daily.    . Omega-3 Fatty Acids (FISH OIL) 1000 MG CAPS Take 1 each by mouth daily.     . simvastatin (ZOCOR) 20 MG tablet Take 20 mg by mouth daily.    . Turmeric 500 MG CAPS Take 1 capsule by mouth daily.     . vitamin C (ASCORBIC ACID) 500 MG tablet Take 500 mg by mouth daily.     No current facility-administered medications for this visit.    Facility-Administered Medications Ordered in Other Visits  Medication Dose Route Frequency Provider Last Rate Last Dose  . iopamidol (ISOVUE-300) 61 % injection             Social History   Socioeconomic History  . Marital status: Divorced    Spouse name: Not on file  . Number of children: Not on file  . Years of education: Not on file  . Highest education level: Not on file  Occupational History  . Not on file  Social Needs  . Financial resource strain: Not on file  . Food insecurity:    Worry: Not on file    Inability: Not on file  . Transportation needs:    Medical: Not on file    Non-medical: Not on file  Tobacco Use  . Smoking status: Never Smoker  . Smokeless tobacco: Never Used  Substance and Sexual Activity  . Alcohol use: No  . Drug use: No  . Sexual activity: Not Currently  Lifestyle  . Physical activity:    Days per week: Not on file    Minutes per session: Not on file  . Stress: Not on file  Relationships  . Social connections:    Talks on phone: Not on file    Gets together: Not on file    Attends religious service: Not on file    Active member of club or organization: Not on file    Attends meetings of clubs or organizations: Not on file    Relationship status: Not on file  . Intimate partner violence:    Fear of current or ex partner: Not on file    Emotionally abused: Not on file    Physically abused: Not on file    Forced sexual activity: Not on file  Other Topics Concern   . Not on file  Social History Narrative  . Not on file    Family History  Problem Relation Age of Onset  . Ovarian cancer Mother   . Heart Problems Father   . Coronary artery disease Brother       [name], MD 06/07/2018, 1:19 PM

## 2018-06-07 NOTE — Patient Instructions (Signed)
Plan to follow up on September 10 with Dr. Fermin Schwab for a biopsy.  Please call for any needs, concerns, or new symptoms such as heavy bleeding.

## 2018-06-21 ENCOUNTER — Encounter: Payer: Self-pay | Admitting: Gynecology

## 2018-06-21 ENCOUNTER — Inpatient Hospital Stay: Payer: 59 | Attending: Obstetrics | Admitting: Gynecology

## 2018-06-21 ENCOUNTER — Other Ambulatory Visit (HOSPITAL_COMMUNITY)
Admission: RE | Admit: 2018-06-21 | Discharge: 2018-06-21 | Disposition: A | Discharge status: Home / Self Care | Payer: 59 | Source: Ambulatory Visit | Attending: Gynecology | Admitting: Gynecology

## 2018-06-21 VITALS — BP 151/67 | HR 67 | Temp 98.6°F | Resp 18 | Ht 64.5 in | Wt 202.0 lb

## 2018-06-21 DIAGNOSIS — D398 Neoplasm of uncertain behavior of other specified female genital organs: Secondary | ICD-10-CM

## 2018-06-21 DIAGNOSIS — Z90722 Acquired absence of ovaries, bilateral: Secondary | ICD-10-CM

## 2018-06-21 DIAGNOSIS — C541 Malignant neoplasm of endometrium: Secondary | ICD-10-CM | POA: Insufficient documentation

## 2018-06-21 DIAGNOSIS — Z923 Personal history of irradiation: Secondary | ICD-10-CM | POA: Diagnosis not present

## 2018-06-21 DIAGNOSIS — Z90710 Acquired absence of both cervix and uterus: Secondary | ICD-10-CM | POA: Insufficient documentation

## 2018-06-21 DIAGNOSIS — C7982 Secondary malignant neoplasm of genital organs: Secondary | ICD-10-CM | POA: Insufficient documentation

## 2018-06-21 NOTE — Progress Notes (Signed)
Consult Note: Gyn-Onc   [name] 67 y.o. female  Chief Complaint  Patient presents with  . Endometrial cancer ([place])    Assessment and plan: New lesion in the upper vagina suspicious for recurrent cancer.  Pap smear and biopsy of the upper vaginal mass is obtained.  Given the very high probability this recurrent cancer we will schedule the patient to see Dr. Sondra Come seeking his opinion as to whether radiation therapy is an option.  It is possible that I could excise the great majority of the upper vaginal lesion excising the majority of tumor volume and then allowing Dr. Sondra Come to do some vaginal brach therapy.  If radiation therapy is not possible, I might consider performing a more radical upper vaginectomy (given that CT scan shows no evidence of other metastatic sites).  We will contact the patient with the Pap smear and biopsy report as soon as it is available.  Interval History:  The patient returns today as scheduled.  She has a suspicious lesion in the upper vagina but biopsy has not been conclusive.  She had a CT scan showing no evidence of other metastatic disease.  Since my last visit with her 2 weeks ago she has had no further bleeding or spotting.  She denies any other pelvic symptoms including pain or pressure.  Functional status is good.   HPI  In 2003 the patient underwent abdominal hysterectomy for a stage IB grade 1 endometrial carcinoma. With good prognostic features she had no subsequent therapy. 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 These were biopsied. She was then treated with 6 cycles of carboplatin and Taxol had a partial response. Subsequently she was placed on Megace 40 mg 3 times a day. She then had a complete response with resolution of all pulmonary nodules. She's continued to take Megace for subsequent 10 years. She also received pelvic radiation therapy to treat the vaginal  recurrence.  Review of Systems:10 point review of systems is negative as noted above.   Vitals: Blood pressure (!) 151/67, pulse 67, temperature 98.6 F (37 C), temperature source Oral, resp. rate 18, height 5' 4.5" (1.638 m), weight 202 lb (91.6 kg), SpO2 99 %.  Physical Exam: General : The patient is a healthy obese woman in no acute distress.  HEENT: normocephalic, extraoccular movements normal; neck is supple without thyromegally  Lynphnodes: Supraclavicular and inguinal nodes not enlarged Breasts: Without masses skin changes or discharge.  Abdomen: Soft, non-tender, no ascites, no organomegally, no masses, no hernias  Pelvic:  EGBUS: Normal female  Vagina: Normal, there is approximately 3 cm polypoid lesion in the left upper vaginal fornix. Urethra and Bladder: Normal, non-tender  Cervix: Surgically absent  Uterus: Surgically absent  Bi-manual examination: Non-tender; no adenxal masses or nodularity  Rectal: normal sphincter tone, no masses, no blood  Lower extremities: No edema or varicosities. Normal range of motion  Procedure note: After obtaining verbal informed consent and taken to timeout biopsy is obtained from the polypoid lesion.  Hemostasis achieved with silver nitrate.    Allergies  Allergen Reactions  . Compazine Other (See Comments)    Pt does not want med due to strange side effects     Past Medical History:  Diagnosis Date  . Diabetes mellitus   . Endometrial cancer ([place])    Metastatic, Stage IB grade 1  . Hypertension   . Obesity   . PONV (postoperative nausea and vomiting)  Past Surgical History:  Procedure Laterality Date  . ABDOMINAL HYSTERECTOMY  04/2002   TAH  . BILATERAL OOPHORECTOMY  1998  . BREAST SURGERY     reduction  . CESAREAN SECTION    . CHOLECYSTECTOMY    . COLONOSCOPY N/A 12/18/2016   Procedure: COLONOSCOPY;  Surgeon: Danie Binder, MD;  [facility];  Service: Endoscopy;  Laterality: N/A;  9:45 AM  . REDUCTION  MAMMAPLASTY Bilateral   . thoracic lung biopsy Left   . TONSILLECTOMY      Current Outpatient Medications  Medication Sig Dispense Refill  . aspirin EC 81 MG tablet Take 81 mg by mouth daily.    . Calcium Carb-Cholecalciferol (CALCIUM 500+D PO) Take 1 tablet by mouth daily.    . cholecalciferol (VITAMIN D) 1000 UNITS tablet Take 1,000 Units by mouth daily.    Marland Kitchen CINNAMON PO Take 1,000 mg by mouth daily.     . Garlic 7893 MG CAPS Take 1 capsule by mouth daily.     Marland Kitchen GLIPIZIDE XL PO Take 20 mg by mouth daily.    Marland Kitchen lisinopril (PRINIVIL,ZESTRIL) 20 MG tablet Take 20 mg by mouth daily.    . megestrol (MEGACE) 40 MG tablet Take 1 tablet (40 mg total) by mouth 3 (three) times daily. 90 tablet 3  . metFORMIN (GLUCOPHAGE) 1000 MG tablet Take 1,000 mg by mouth 2 (two) times daily with a meal.     . Multiple Vitamin (MULTI VITAMIN DAILY PO) Take 1 each by mouth daily.    . Omega-3 Fatty Acids (FISH OIL) 1000 MG CAPS Take 1 each by mouth daily.     . simvastatin (ZOCOR) 20 MG tablet Take 20 mg by mouth daily.    . Turmeric 500 MG CAPS Take 1 capsule by mouth daily.     . vitamin C (ASCORBIC ACID) 500 MG tablet Take 500 mg by mouth daily.     No current facility-administered medications for this visit.     Social History   Socioeconomic History  . Marital status: Divorced    Spouse name: Not on file  . Number of children: Not on file  . Years of education: Not on file  . Highest education level: Not on file  Occupational History  . Not on file  Social Needs  . Financial resource strain: Not on file  . Food insecurity:    Worry: Not on file    Inability: Not on file  . Transportation needs:    Medical: Not on file    Non-medical: Not on file  Tobacco Use  . Smoking status: Never Smoker  . Smokeless tobacco: Never Used  Substance and Sexual Activity  . Alcohol use: No  . Drug use: No  . Sexual activity: Not Currently  Lifestyle  . Physical activity:    Days per week: Not on file     Minutes per session: Not on file  . Stress: Not on file  Relationships  . Social connections:    Talks on phone: Not on file    Gets together: Not on file    Attends religious service: Not on file    Active member of club or organization: Not on file    Attends meetings of clubs or organizations: Not on file    Relationship status: Not on file  . Intimate partner violence:    Fear of current or ex partner: Not on file    Emotionally abused: Not on file    Physically  abused: Not on file    Forced sexual activity: Not on file  Other Topics Concern  . Not on file  Social History Narrative  . Not on file    Family History  Problem Relation Age of Onset  . Ovarian cancer Mother   . Heart Problems Father   . Coronary artery disease Brother       [name], MD 06/21/2018, 12:30 PM

## 2018-06-21 NOTE — Addendum Note (Signed)
Addended by: Joylene John D on: 06/21/2018 02:45 PM   Modules accepted: Orders

## 2018-06-21 NOTE — Patient Instructions (Signed)
We will contact you with the results of your biopsy from today.  Based on those results, plan to meet with Dr. Gery Pray in Radiation to discuss further recommendations.  Please call our office for any questions or concerns.

## 2018-06-21 NOTE — H&P (View-Only) (Signed)
Consult Note: Gyn-Onc   [name] 67 y.o. female  Chief Complaint  Patient presents with  . Endometrial cancer ([place])    Assessment and plan: New lesion in the upper vagina suspicious for recurrent cancer.  Pap smear and biopsy of the upper vaginal mass is obtained.  Given the very high probability this recurrent cancer we will schedule the patient to see Dr. Sondra Come seeking his opinion as to whether radiation therapy is an option.  It is possible that I could excise the great majority of the upper vaginal lesion excising the majority of tumor volume and then allowing Dr. Sondra Come to do some vaginal brach therapy.  If radiation therapy is not possible, I might consider performing a more radical upper vaginectomy (given that CT scan shows no evidence of other metastatic sites).  We will contact the patient with the Pap smear and biopsy report as soon as it is available.  Interval History:  The patient returns today as scheduled.  She has a suspicious lesion in the upper vagina but biopsy has not been conclusive.  She had a CT scan showing no evidence of other metastatic disease.  Since my last visit with her 2 weeks ago she has had no further bleeding or spotting.  She denies any other pelvic symptoms including pain or pressure.  Functional status is good.   HPI  In 2003 the patient underwent abdominal hysterectomy for a stage IB grade 1 endometrial carcinoma. With good prognostic features she had no subsequent therapy. 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 These were biopsied. She was then treated with 6 cycles of carboplatin and Taxol had a partial response. Subsequently she was placed on Megace 40 mg 3 times a day. She then had a complete response with resolution of all pulmonary nodules. She's continued to take Megace for subsequent 10 years. She also received pelvic radiation therapy to treat the vaginal  recurrence.  Review of Systems:10 point review of systems is negative as noted above.   Vitals: Blood pressure (!) 151/67, pulse 67, temperature 98.6 F (37 C), temperature source Oral, resp. rate 18, height 5' 4.5" (1.638 m), weight 202 lb (91.6 kg), SpO2 99 %.  Physical Exam: General : The patient is a healthy obese woman in no acute distress.  HEENT: normocephalic, extraoccular movements normal; neck is supple without thyromegally  Lynphnodes: Supraclavicular and inguinal nodes not enlarged Breasts: Without masses skin changes or discharge.  Abdomen: Soft, non-tender, no ascites, no organomegally, no masses, no hernias  Pelvic:  EGBUS: Normal female  Vagina: Normal, there is approximately 3 cm polypoid lesion in the left upper vaginal fornix. Urethra and Bladder: Normal, non-tender  Cervix: Surgically absent  Uterus: Surgically absent  Bi-manual examination: Non-tender; no adenxal masses or nodularity  Rectal: normal sphincter tone, no masses, no blood  Lower extremities: No edema or varicosities. Normal range of motion  Procedure note: After obtaining verbal informed consent and taken to timeout biopsy is obtained from the polypoid lesion.  Hemostasis achieved with silver nitrate.    Allergies  Allergen Reactions  . Compazine Other (See Comments)    Pt does not want med due to strange side effects     Past Medical History:  Diagnosis Date  . Diabetes mellitus   . Endometrial cancer ([place])    Metastatic, Stage IB grade 1  . Hypertension   . Obesity   . PONV (postoperative nausea and vomiting)  Past Surgical History:  Procedure Laterality Date  . ABDOMINAL HYSTERECTOMY  04/2002   TAH  . BILATERAL OOPHORECTOMY  1998  . BREAST SURGERY     reduction  . CESAREAN SECTION    . CHOLECYSTECTOMY    . COLONOSCOPY N/A 12/18/2016   Procedure: COLONOSCOPY;  Surgeon: Danie Binder, MD;  [facility];  Service: Endoscopy;  Laterality: N/A;  9:45 AM  . REDUCTION  MAMMAPLASTY Bilateral   . thoracic lung biopsy Left   . TONSILLECTOMY      Current Outpatient Medications  Medication Sig Dispense Refill  . aspirin EC 81 MG tablet Take 81 mg by mouth daily.    . Calcium Carb-Cholecalciferol (CALCIUM 500+D PO) Take 1 tablet by mouth daily.    . cholecalciferol (VITAMIN D) 1000 UNITS tablet Take 1,000 Units by mouth daily.    Marland Kitchen CINNAMON PO Take 1,000 mg by mouth daily.     . Garlic 7253 MG CAPS Take 1 capsule by mouth daily.     Marland Kitchen GLIPIZIDE XL PO Take 20 mg by mouth daily.    Marland Kitchen lisinopril (PRINIVIL,ZESTRIL) 20 MG tablet Take 20 mg by mouth daily.    . megestrol (MEGACE) 40 MG tablet Take 1 tablet (40 mg total) by mouth 3 (three) times daily. 90 tablet 3  . metFORMIN (GLUCOPHAGE) 1000 MG tablet Take 1,000 mg by mouth 2 (two) times daily with a meal.     . Multiple Vitamin (MULTI VITAMIN DAILY PO) Take 1 each by mouth daily.    . Omega-3 Fatty Acids (FISH OIL) 1000 MG CAPS Take 1 each by mouth daily.     . simvastatin (ZOCOR) 20 MG tablet Take 20 mg by mouth daily.    . Turmeric 500 MG CAPS Take 1 capsule by mouth daily.     . vitamin C (ASCORBIC ACID) 500 MG tablet Take 500 mg by mouth daily.     No current facility-administered medications for this visit.     Social History   Socioeconomic History  . Marital status: Divorced    Spouse name: Not on file  . Number of children: Not on file  . Years of education: Not on file  . Highest education level: Not on file  Occupational History  . Not on file  Social Needs  . Financial resource strain: Not on file  . Food insecurity:    Worry: Not on file    Inability: Not on file  . Transportation needs:    Medical: Not on file    Non-medical: Not on file  Tobacco Use  . Smoking status: Never Smoker  . Smokeless tobacco: Never Used  Substance and Sexual Activity  . Alcohol use: No  . Drug use: No  . Sexual activity: Not Currently  Lifestyle  . Physical activity:    Days per week: Not on file     Minutes per session: Not on file  . Stress: Not on file  Relationships  . Social connections:    Talks on phone: Not on file    Gets together: Not on file    Attends religious service: Not on file    Active member of club or organization: Not on file    Attends meetings of clubs or organizations: Not on file    Relationship status: Not on file  . Intimate partner violence:    Fear of current or ex partner: Not on file    Emotionally abused: Not on file    Physically  abused: Not on file    Forced sexual activity: Not on file  Other Topics Concern  . Not on file  Social History Narrative  . Not on file    Family History  Problem Relation Age of Onset  . Ovarian cancer Mother   . Heart Problems Father   . Coronary artery disease Brother       [name], MD 06/21/2018, 12:30 PM

## 2018-06-22 NOTE — Progress Notes (Signed)
GYN Location of Tumor / Histology: Endometrial  Tavon Corriher Kimrey presented with symptoms of: 05/26/18: Patient called stating "I started bleeding yesterday. Yesterday was a menstrual type bleeding, it was bright red blood. Today is just spotting, it is pink. I'm not having any pain or cramping. I'm also raw down there. I have not done anything new like heavy lifting, execrise or intercourse. The only thing I can think of but doubt that is what is causing this is my PCP changed my diabetes medicines." Per Lenna Sciara [facility] patient scheduled to see Dr. Gerarda Fraction this afternoon.  Biopsies revealed: New biopsies pending. 05/26/18 biopsy inconclusive. Biopsy from 06/07/18 pending.  Past/Anticipated interventions by Gyn/Onc surgery, if any: Per Dr. Fermin Schwab 06/21/18:  Assessment and plan: New lesion in the upper vagina suspicious for recurrent cancer.  Pap smear and biopsy of the upper vaginal mass is obtained.  Given the very high probability this recurrent cancer we will schedule the patient to see Dr. Sondra Come seeking his opinion as to whether radiation therapy is an option.  It is possible that I could excise the great majority of the upper vaginal lesion excising the majority of tumor volume and then allowing Dr. Sondra Come to do some vaginal brach therapy.  If radiation therapy is not possible, I might consider performing a more radical upper vaginectomy (given that CT scan shows no evidence of other metastatic sites).  We will contact the patient with the Pap smear and biopsy report as soon as it is available.  Interval History:  The patient returns today as scheduled.  She has a suspicious lesion in the upper vagina but biopsy has not been conclusive.  She had a CT scan showing no evidence of other metastatic disease.  Since my last visit with her 2 weeks ago she has had no further bleeding or spotting.  She denies any other pelvic symptoms including pain or pressure.  Functional status is good.   HPI  In 2003 the  patient underwent abdominal hysterectomy for a stage IB grade 1 endometrial carcinoma. With good prognostic features she had no subsequent therapy. 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 These were biopsied. She was then treated with 6 cycles of carboplatin and Taxol had a partial response. Subsequently she was placed on Megace 40 mg 3 times a day. She then had a complete response with resolution of all pulmonary nodules. She's continued to take Megace for subsequent 10 years. She also received pelvic radiation therapy to treat the vaginal recurrence.  Past/Anticipated interventions by medical oncology, if any: None at this time.  Weight changes, if any: Pt denies any weight changes  Bowel/Bladder complaints, if any: Pt denies dysuria/hematuria. Pt denies rectal bleeding, diarrhea/constipation.  Pt states she had vaginal bleeding after biopsy and will occasionally has "pink tinges". Pt does not wear a pad.  Nausea/Vomiting, if any: Pt denies.  Pain issues, if any:  Pt denies c/o pain.  SAFETY ISSUES:  Prior radiation? Yes, pelvic radiation in 2006  Pacemaker/ICD? No  Possible current pregnancy? N/A, pt is post-surgical  Is the patient on methotrexate? No  Current Complaints / other details:  Pt presents today for reconsult with Dr. Sondra Come for Radiation Oncology. Pt has recently had biopsy that came back as recurrence of endometrial cancer. Pt is unaccompanied. Pt denies c/o pain. Pt denies c/o distress and scored 0/10 on distress screening tool.  Loma Sousa, RN BSN

## 2018-06-23 ENCOUNTER — Telehealth: Payer: Self-pay

## 2018-06-23 NOTE — Telephone Encounter (Signed)
Told [name] that the biopsy showed recurrent endometrial cancer.  She has an appointment scheduled with Dr. Sondra Come on Monday 06-27-18 to determine if radiation is an option for treatment. Pt verbalized understanding.

## 2018-06-27 ENCOUNTER — Ambulatory Visit
Admission: RE | Admit: 2018-06-27 | Discharge: 2018-06-27 | Disposition: A | Discharge status: Home / Self Care | Payer: 59 | Source: Ambulatory Visit | Attending: Radiation Oncology | Admitting: Radiation Oncology

## 2018-06-27 ENCOUNTER — Encounter: Payer: Self-pay | Admitting: Radiation Oncology

## 2018-06-27 ENCOUNTER — Other Ambulatory Visit: Payer: Self-pay

## 2018-06-27 VITALS — BP 150/83 | HR 80 | Temp 98.3°F | Resp 20 | Ht 65.0 in | Wt 197.6 lb

## 2018-06-27 DIAGNOSIS — Z510 Encounter for antineoplastic radiation therapy: Secondary | ICD-10-CM | POA: Diagnosis not present

## 2018-06-27 DIAGNOSIS — C541 Malignant neoplasm of endometrium: Secondary | ICD-10-CM | POA: Diagnosis present

## 2018-06-27 LAB — CYTOLOGY - PAP

## 2018-06-27 NOTE — Progress Notes (Signed)
Radiation Oncology         [phone] ________________________________  Name: [name] MRN: [id number]  Date: 06/27/2018  DOB: 13-May-1951  Re-evaluation Note  CC: Jake Samples, PA-C  [name]    ICD-10-CM   1. Endometrial cancer ([place]) C54.1     Diagnosis:   Recurrent endometrial cancer  Interval Since Last Radiation:  13 years and 3 months   Patient was treated with intracavitary brachytherapy treatments using a 3.5 cm diameter cylinder with a treatment length of 5 cm. The patient received 30 Gy in 5 fractions. Patient was treated in mid May and completed her treatments 03/17/2005.  Narrative:  The patient returns today for further evaluation and consideration for additional radiation therapy as part of management of the patient's recurrent endometrial cancer. Patient is seen out of the courtesy of Dr. Carlena Bjornstad. Patient was initially diagnosed with endometrial cancer in 2003 at which time she underwent total abdominal hysterectomy. The patient was found to have a FIGO grade 1 endometrioid adenocarcinoma with some squamous metaplasia. There was minimal myometrial invasion with focal vascular space invasion. No adjuvant treatment was recommended at that time. Patient in 2006 however developed a vaginal cuff recurrence, biopsy proven. On staging workup the patient surprisingly was found to have multiple small pulmonary metastasis. Patient was treated with carboplatin and Taxol by Dr. Rosezetta Schlatter. The patient had a complete response to her chemotherapy and on  repeat Pap smear at a later date 2006 which confirmed the nodular cell abnormality favoring adenocarcinoma of endometrial origin. The patient proceeded to undergo vaginal brachtherapy in 2006. The patient was treated with a 3.5 cm diameter cylinder with a treatment length of 5 cm. The patient received 30 gray in 5 fractions. She did well with her vaginal brachytherapy with no evidence recurrence. At a  later date the patient developed pulmonary metastasis and was treated with Megace with good response.  The patient was treated with Megace for 10 years and this medication was discontinued. Patient most recently presented with vaginal bleeding and on pelvic exam the patient was noted to have a lesion within the proximal vagina. This was biopsied on September 10th and biopsy revealed adenocarcinoma consistent with  recurrent endometrial adenocarcinoma. Patient is now seen in radiation oncology for consideration for additional treatment.                            ALLERGIES:  is allergic to compazine.  Meds: Current Outpatient Medications  Medication Sig Dispense Refill  . aspirin EC 81 MG tablet Take 81 mg by mouth daily.    . Calcium Carb-Cholecalciferol (CALCIUM 500+D PO) Take 1 tablet by mouth daily.    . cholecalciferol (VITAMIN D) 1000 UNITS tablet Take 1,000 Units by mouth daily.    Marland Kitchen CINNAMON PO Take 1,000 mg by mouth daily.     . Garlic 1194 MG CAPS Take 1 capsule by mouth daily.     Marland Kitchen GLIPIZIDE XL PO Take 10 mg by mouth daily.     Marland Kitchen lisinopril (PRINIVIL,ZESTRIL) 20 MG tablet Take 20 mg by mouth daily.    . megestrol (MEGACE) 40 MG tablet Take 1 tablet (40 mg total) by mouth 3 (three) times daily. 90 tablet 3  . metFORMIN (GLUCOPHAGE) 1000 MG tablet Take 1,000 mg by mouth 2 (two) times daily with a meal.     . Multiple Vitamin (MULTI VITAMIN DAILY PO) Take 1 each by mouth daily.    Marland Kitchen  Omega-3 Fatty Acids (FISH OIL) 1000 MG CAPS Take 1 each by mouth daily.     . simvastatin (ZOCOR) 20 MG tablet Take 20 mg by mouth daily.    . Turmeric 500 MG CAPS Take 1 capsule by mouth daily.     . vitamin C (ASCORBIC ACID) 500 MG tablet Take 500 mg by mouth daily.     No current facility-administered medications for this encounter.     Physical Findings: The patient is in no acute distress. Patient is alert and oriented.  height is 5\' 5"  (1.651 m) and weight is 197 lb 9.6 oz (89.6 kg). Her oral  temperature is 98.3 F (36.8 C). Her blood pressure is 150/83 (abnormal) and her pulse is 80. Her respiration is 20 and oxygen saturation is 98%. .  Lungs are clear to auscultation bilaterally. Heart has regular rate and rhythm. No palpable cervical, supraclavicular, or axillary adenopathy. Abdomen soft, non-tender, normal bowel sounds. On pelvic examination patient was noted to have a  2.5 cm polypoid lesion in the left upper vaginal vault. On bimanual and rectovaginal examination there no other palpable masses appreciated.  Lab Findings: No results found for: WBC, HGB, HCT, MCV, PLT  Radiographic Findings: Ct Chest W Contrast  Result Date: 06/07/2018 CLINICAL DATA:  Endometrial carcinoma with lung nodules. EXAM: CT CHEST, ABDOMEN, AND PELVIS WITH CONTRAST TECHNIQUE: Multidetector CT imaging of the chest, abdomen and pelvis was performed following the standard protocol during bolus administration of intravenous contrast. CONTRAST:  145mL OMNIPAQUE IOHEXOL 300 MG/ML  SOLN COMPARISON:  09/22/2013 FINDINGS: CT CHEST FINDINGS Cardiovascular: Normal heart size. No pericardial effusion identified. Mild aortic atherosclerosis. Mediastinum/Nodes: Normal appearance of the thyroid gland. The trachea appears patent and is midline. Normal appearance of the esophagus. No axillary or supraclavicular adenopathy. No mediastinal or hilar adenopathy. Lungs/Pleura: No pleural effusion identified. Chronic scar/postsurgical change within the lingula is again identified, image 57/4. Postoperative changes within the right lung are stable. No suspicious pulmonary nodule or mass. Musculoskeletal: Spondylosis identified within the thoracic spine. No aggressive lytic or sclerotic bone lesions. CT ABDOMEN PELVIS FINDINGS Hepatobiliary: No focal liver abnormality is seen. Status post cholecystectomy. No biliary dilatation. Pancreas: Unremarkable. No pancreatic ductal dilatation or surrounding inflammatory changes. Spleen: Normal in  size without focal abnormality. Adrenals/Urinary Tract: Normal adrenal glands. Unchanged 9 mm low-attenuation cortical based structure in the upper pole of left kidney which is too small to characterize. Dystrophic calcifications within the inferior pole urinary bladder is unremarkable. Urinary bladder normal. Stomach/Bowel: Stomach appears normal. The small bowel loops are normal. The appendix is visualized and appears normal. Unremarkable appearance of the colon. Vascular/Lymphatic: Aortic atherosclerosis. No enlarged lymph nodes within the abdomen or pelvis. Reproductive: Status post hysterectomy. No adnexal masses. Other: No ascites.  No suspicious peritoneal nodule or mass. Musculoskeletal: Spondylosis identified within the lumbar spine. No aggressive lytic or sclerotic bone lesions IMPRESSION: 1. Stable exam. No findings identified to suggest recurrent tumor or metastatic disease. 2. Stable postsurgical changes involving the chest. 3.  Aortic Atherosclerosis (ICD10-I70.0). Electronically Signed   By: Kerby Moors M.D.   On: 06/07/2018 13:51   Ct Abdomen Pelvis W Contrast  Result Date: 06/07/2018 CLINICAL DATA:  Endometrial carcinoma with lung nodules. EXAM: CT CHEST, ABDOMEN, AND PELVIS WITH CONTRAST TECHNIQUE: Multidetector CT imaging of the chest, abdomen and pelvis was performed following the standard protocol during bolus administration of intravenous contrast. CONTRAST:  174mL OMNIPAQUE IOHEXOL 300 MG/ML  SOLN COMPARISON:  09/22/2013 FINDINGS: CT CHEST FINDINGS Cardiovascular: Normal  heart size. No pericardial effusion identified. Mild aortic atherosclerosis. Mediastinum/Nodes: Normal appearance of the thyroid gland. The trachea appears patent and is midline. Normal appearance of the esophagus. No axillary or supraclavicular adenopathy. No mediastinal or hilar adenopathy. Lungs/Pleura: No pleural effusion identified. Chronic scar/postsurgical change within the lingula is again identified, image  57/4. Postoperative changes within the right lung are stable. No suspicious pulmonary nodule or mass. Musculoskeletal: Spondylosis identified within the thoracic spine. No aggressive lytic or sclerotic bone lesions. CT ABDOMEN PELVIS FINDINGS Hepatobiliary: No focal liver abnormality is seen. Status post cholecystectomy. No biliary dilatation. Pancreas: Unremarkable. No pancreatic ductal dilatation or surrounding inflammatory changes. Spleen: Normal in size without focal abnormality. Adrenals/Urinary Tract: Normal adrenal glands. Unchanged 9 mm low-attenuation cortical based structure in the upper pole of left kidney which is too small to characterize. Dystrophic calcifications within the inferior pole urinary bladder is unremarkable. Urinary bladder normal. Stomach/Bowel: Stomach appears normal. The small bowel loops are normal. The appendix is visualized and appears normal. Unremarkable appearance of the colon. Vascular/Lymphatic: Aortic atherosclerosis. No enlarged lymph nodes within the abdomen or pelvis. Reproductive: Status post hysterectomy. No adnexal masses. Other: No ascites.  No suspicious peritoneal nodule or mass. Musculoskeletal: Spondylosis identified within the lumbar spine. No aggressive lytic or sclerotic bone lesions IMPRESSION: 1. Stable exam. No findings identified to suggest recurrent tumor or metastatic disease. 2. Stable postsurgical changes involving the chest. 3.  Aortic Atherosclerosis (ICD10-I70.0). Electronically Signed   By: Kerby Moors M.D.   On: 06/07/2018 13:51    Impression: Recurrent endometrial cancer. The patient has an isolated recurrence within the proximal vagina. she has had prior high-dose brachytherapy treatments to this area and would not be a ideal candidate for a full course of brachytherapy. Given the thickness of this lesion, brachytherapy alone with a  cylinder would not adequately cover this lesion initially either. She has not received any external beam  treatments and would be a candidate for external beam radiation therapy with a small field directed at the site of her recurrence. I would anticipate this would be approximately 5 weeks of radiation therapy. The patient may be a candidate for one or 2 brachytherapy sessions after her initial course of external beam treatments. I would not recommend whole pelvic radiation therapy since she has already documented lung metastasis in the past. Per review of Dr. Loletta Specter- Pearson's note she may also be a candidate for a limited resection followed by radiation therapy.  Plan:  Final treatment details are pending discussion with Dr. Fermin Schwab.  ____________________________________ Gery Pray, MD

## 2018-07-05 ENCOUNTER — Telehealth: Payer: Self-pay

## 2018-07-05 NOTE — Telephone Encounter (Signed)
Told [name] that her surgery is scheduled for 07-19-18 with Dr. Fermin Schwab at the [facility] patient surgery center. She will receive a call from the out patient surgery nurse 1-2 days prior to the procedure to review instruction. Surgery is to be in the afternoon. If there are any changes to this date and time, she will be contacted by [name] office.  Pt verbalized understanding.

## 2018-07-06 ENCOUNTER — Telehealth: Payer: Self-pay

## 2018-07-06 NOTE — Telephone Encounter (Signed)
Incoming call from patient, pt requested copies of her surgical and cytology report for her insurance. Ok per [facility] NP. Copy given to patient- confirmed HIPAA. No other needs per pt at this time.

## 2018-07-14 ENCOUNTER — Other Ambulatory Visit: Payer: Self-pay

## 2018-07-14 ENCOUNTER — Encounter (HOSPITAL_BASED_OUTPATIENT_CLINIC_OR_DEPARTMENT_OTHER): Payer: Self-pay

## 2018-07-14 NOTE — Progress Notes (Signed)
Spoke with:  Rika NPO:  No food after midnight/Clear liquids until 8:00AM DOS Arrival time: 1200N Labs: Istat 8, EKG AM medications:  Megace Pre op orders: Yes Ride home: Pilar Plate (friend) [phone]

## 2018-07-19 ENCOUNTER — Encounter (HOSPITAL_BASED_OUTPATIENT_CLINIC_OR_DEPARTMENT_OTHER): Payer: Self-pay | Admitting: *Deleted

## 2018-07-19 ENCOUNTER — Encounter (HOSPITAL_BASED_OUTPATIENT_CLINIC_OR_DEPARTMENT_OTHER)
Admission: RE | Disposition: A | Discharge status: Home / Self Care | Payer: Self-pay | Source: Ambulatory Visit | Attending: Gynecology

## 2018-07-19 ENCOUNTER — Ambulatory Visit (HOSPITAL_BASED_OUTPATIENT_CLINIC_OR_DEPARTMENT_OTHER)
Admission: RE | Admit: 2018-07-19 | Discharge: 2018-07-19 | Disposition: A | Discharge status: Home / Self Care | Payer: 59 | Source: Ambulatory Visit | Attending: Gynecology | Admitting: Gynecology

## 2018-07-19 ENCOUNTER — Ambulatory Visit (HOSPITAL_BASED_OUTPATIENT_CLINIC_OR_DEPARTMENT_OTHER): Payer: 59 | Admitting: Anesthesiology

## 2018-07-19 ENCOUNTER — Other Ambulatory Visit: Payer: Self-pay

## 2018-07-19 DIAGNOSIS — Z9221 Personal history of antineoplastic chemotherapy: Secondary | ICD-10-CM | POA: Diagnosis not present

## 2018-07-19 DIAGNOSIS — Z9049 Acquired absence of other specified parts of digestive tract: Secondary | ICD-10-CM | POA: Diagnosis not present

## 2018-07-19 DIAGNOSIS — Z90710 Acquired absence of both cervix and uterus: Secondary | ICD-10-CM | POA: Diagnosis not present

## 2018-07-19 DIAGNOSIS — Z6832 Body mass index (BMI) 32.0-32.9, adult: Secondary | ICD-10-CM | POA: Insufficient documentation

## 2018-07-19 DIAGNOSIS — C7982 Secondary malignant neoplasm of genital organs: Secondary | ICD-10-CM

## 2018-07-19 DIAGNOSIS — Z8041 Family history of malignant neoplasm of ovary: Secondary | ICD-10-CM | POA: Diagnosis not present

## 2018-07-19 DIAGNOSIS — Z923 Personal history of irradiation: Secondary | ICD-10-CM | POA: Insufficient documentation

## 2018-07-19 DIAGNOSIS — C52 Malignant neoplasm of vagina: Secondary | ICD-10-CM | POA: Insufficient documentation

## 2018-07-19 DIAGNOSIS — E119 Type 2 diabetes mellitus without complications: Secondary | ICD-10-CM | POA: Insufficient documentation

## 2018-07-19 DIAGNOSIS — Z7984 Long term (current) use of oral hypoglycemic drugs: Secondary | ICD-10-CM | POA: Insufficient documentation

## 2018-07-19 DIAGNOSIS — Z8589 Personal history of malignant neoplasm of other organs and systems: Secondary | ICD-10-CM | POA: Diagnosis not present

## 2018-07-19 DIAGNOSIS — Z85118 Personal history of other malignant neoplasm of bronchus and lung: Secondary | ICD-10-CM | POA: Insufficient documentation

## 2018-07-19 DIAGNOSIS — Z8249 Family history of ischemic heart disease and other diseases of the circulatory system: Secondary | ICD-10-CM | POA: Diagnosis not present

## 2018-07-19 DIAGNOSIS — I10 Essential (primary) hypertension: Secondary | ICD-10-CM | POA: Insufficient documentation

## 2018-07-19 DIAGNOSIS — Z888 Allergy status to other drugs, medicaments and biological substances status: Secondary | ICD-10-CM | POA: Diagnosis not present

## 2018-07-19 DIAGNOSIS — Z7982 Long term (current) use of aspirin: Secondary | ICD-10-CM | POA: Insufficient documentation

## 2018-07-19 DIAGNOSIS — C541 Malignant neoplasm of endometrium: Secondary | ICD-10-CM | POA: Diagnosis not present

## 2018-07-19 DIAGNOSIS — Z8542 Personal history of malignant neoplasm of other parts of uterus: Secondary | ICD-10-CM | POA: Diagnosis not present

## 2018-07-19 DIAGNOSIS — E669 Obesity, unspecified: Secondary | ICD-10-CM | POA: Diagnosis not present

## 2018-07-19 DIAGNOSIS — I4510 Unspecified right bundle-branch block: Secondary | ICD-10-CM | POA: Diagnosis not present

## 2018-07-19 DIAGNOSIS — Z79899 Other long term (current) drug therapy: Secondary | ICD-10-CM | POA: Insufficient documentation

## 2018-07-19 HISTORY — DX: Other nonrheumatic aortic valve disorders: I35.8

## 2018-07-19 HISTORY — DX: Spondylosis, unspecified: M47.9

## 2018-07-19 HISTORY — DX: Personal history of colon polyps, unspecified: Z86.0100

## 2018-07-19 HISTORY — DX: Malignant neoplasm of unspecified part of unspecified bronchus or lung: C34.90

## 2018-07-19 HISTORY — DX: Presence of spectacles and contact lenses: Z97.3

## 2018-07-19 HISTORY — DX: Pneumothorax, unspecified: J93.9

## 2018-07-19 HISTORY — DX: Atherosclerosis of aorta: I70.0

## 2018-07-19 HISTORY — DX: Unspecified right bundle-branch block: I45.10

## 2018-07-19 HISTORY — DX: Personal history of other diseases of the circulatory system: Z86.79

## 2018-07-19 HISTORY — DX: Diverticulosis of intestine, part unspecified, without perforation or abscess without bleeding: K57.90

## 2018-07-19 HISTORY — DX: Cardiomegaly: I51.7

## 2018-07-19 HISTORY — DX: Other nonspecific abnormal finding of lung field: R91.8

## 2018-07-19 HISTORY — DX: Presence of dental prosthetic device (complete) (partial): Z97.2

## 2018-07-19 HISTORY — DX: Cardiac murmur, unspecified: R01.1

## 2018-07-19 HISTORY — DX: Heart disease, unspecified: I51.9

## 2018-07-19 HISTORY — DX: Cyst of kidney, acquired: N28.1

## 2018-07-19 HISTORY — DX: Other hemorrhoids: K64.8

## 2018-07-19 HISTORY — DX: Personal history of colonic polyps: Z86.010

## 2018-07-19 LAB — POCT I-STAT, CHEM 8
BUN: 16 mg/dL (ref 8–23)
CALCIUM ION: 1.28 mmol/L (ref 1.15–1.40)
Chloride: 108 mmol/L (ref 98–111)
Creatinine, Ser: 0.9 mg/dL (ref 0.44–1.00)
GLUCOSE: 92 mg/dL (ref 70–99)
HCT: 37 % (ref 36.0–46.0)
HEMOGLOBIN: 12.6 g/dL (ref 12.0–15.0)
Potassium: 3.9 mmol/L (ref 3.5–5.1)
SODIUM: 143 mmol/L (ref 135–145)
TCO2: 20 mmol/L — AB (ref 22–32)

## 2018-07-19 LAB — GLUCOSE, CAPILLARY
GLUCOSE-CAPILLARY: 110 mg/dL — AB (ref 70–99)
Glucose-Capillary: 73 mg/dL (ref 70–99)

## 2018-07-19 SURGERY — VAGINECTOMY, PARTIAL
Anesthesia: General | Site: Vagina

## 2018-07-19 MED ORDER — SCOPOLAMINE 1 MG/3DAYS TD PT72
1.0000 | MEDICATED_PATCH | Freq: Once | TRANSDERMAL | Status: DC
  Administered 2018-07-19: 1.5 mg via TRANSDERMAL
  Filled 2018-07-19: qty 1

## 2018-07-19 MED ORDER — ACETAMINOPHEN 500 MG PO TABS
1000.0000 mg | ORAL_TABLET | Freq: Once | ORAL | Status: DC | PRN
  Filled 2018-07-19: qty 2

## 2018-07-19 MED ORDER — PROPOFOL 10 MG/ML IV BOLUS
INTRAVENOUS | Status: DC | PRN
  Administered 2018-07-19: 150 mg via INTRAVENOUS

## 2018-07-19 MED ORDER — PROPOFOL 10 MG/ML IV BOLUS
INTRAVENOUS | Status: AC
  Filled 2018-07-19: qty 20

## 2018-07-19 MED ORDER — OXYCODONE HCL 5 MG PO TABS
5.0000 mg | ORAL_TABLET | Freq: Once | ORAL | Status: DC | PRN
  Filled 2018-07-19: qty 1

## 2018-07-19 MED ORDER — KETOROLAC TROMETHAMINE 30 MG/ML IJ SOLN
INTRAMUSCULAR | Status: DC | PRN
  Administered 2018-07-19: 30 mg via INTRAVENOUS

## 2018-07-19 MED ORDER — MIDAZOLAM HCL 5 MG/5ML IJ SOLN
INTRAMUSCULAR | Status: DC | PRN
  Administered 2018-07-19: 2 mg via INTRAVENOUS

## 2018-07-19 MED ORDER — LIDOCAINE 2% (20 MG/ML) 5 ML SYRINGE
INTRAMUSCULAR | Status: DC | PRN
  Administered 2018-07-19: 100 mg via INTRAVENOUS

## 2018-07-19 MED ORDER — FENTANYL CITRATE (PF) 100 MCG/2ML IJ SOLN
INTRAMUSCULAR | Status: AC
  Filled 2018-07-19: qty 2

## 2018-07-19 MED ORDER — MIDAZOLAM HCL 2 MG/2ML IJ SOLN
INTRAMUSCULAR | Status: AC
  Filled 2018-07-19: qty 2

## 2018-07-19 MED ORDER — SCOPOLAMINE 1 MG/3DAYS TD PT72
MEDICATED_PATCH | TRANSDERMAL | Status: AC
  Filled 2018-07-19: qty 1

## 2018-07-19 MED ORDER — FERRIC SUBSULFATE SOLN
Status: DC | PRN
  Administered 2018-07-19: 1

## 2018-07-19 MED ORDER — ONDANSETRON HCL 4 MG/2ML IJ SOLN
INTRAMUSCULAR | Status: AC
  Filled 2018-07-19: qty 2

## 2018-07-19 MED ORDER — OXYCODONE HCL 5 MG/5ML PO SOLN
5.0000 mg | Freq: Once | ORAL | Status: DC | PRN
  Filled 2018-07-19: qty 5

## 2018-07-19 MED ORDER — KETOROLAC TROMETHAMINE 30 MG/ML IJ SOLN
INTRAMUSCULAR | Status: AC
  Filled 2018-07-19: qty 1

## 2018-07-19 MED ORDER — ACETAMINOPHEN 10 MG/ML IV SOLN
1000.0000 mg | Freq: Once | INTRAVENOUS | Status: DC | PRN
  Filled 2018-07-19: qty 100

## 2018-07-19 MED ORDER — FENTANYL CITRATE (PF) 100 MCG/2ML IJ SOLN
INTRAMUSCULAR | Status: DC | PRN
  Administered 2018-07-19 (×2): 50 ug via INTRAVENOUS

## 2018-07-19 MED ORDER — ACETAMINOPHEN 160 MG/5ML PO SOLN
325.0000 mg | Freq: Once | ORAL | Status: DC | PRN
  Filled 2018-07-19: qty 20.3

## 2018-07-19 MED ORDER — DEXAMETHASONE SODIUM PHOSPHATE 10 MG/ML IJ SOLN
INTRAMUSCULAR | Status: AC
  Filled 2018-07-19: qty 1

## 2018-07-19 MED ORDER — MEPERIDINE HCL 25 MG/ML IJ SOLN
6.2500 mg | INTRAMUSCULAR | Status: DC | PRN
  Filled 2018-07-19: qty 1

## 2018-07-19 MED ORDER — DEXAMETHASONE SODIUM PHOSPHATE 10 MG/ML IJ SOLN
INTRAMUSCULAR | Status: DC | PRN
  Administered 2018-07-19: 10 mg via INTRAVENOUS

## 2018-07-19 MED ORDER — LACTATED RINGERS IV SOLN
INTRAVENOUS | Status: DC
  Administered 2018-07-19: 12:00:00 via INTRAVENOUS
  Filled 2018-07-19: qty 1000

## 2018-07-19 MED ORDER — ONDANSETRON HCL 4 MG/2ML IJ SOLN
INTRAMUSCULAR | Status: DC | PRN
  Administered 2018-07-19: 4 mg via INTRAVENOUS

## 2018-07-19 MED ORDER — FENTANYL CITRATE (PF) 100 MCG/2ML IJ SOLN
25.0000 ug | INTRAMUSCULAR | Status: DC | PRN
  Filled 2018-07-19: qty 1

## 2018-07-19 SURGICAL SUPPLY — 20 items
BLADE SURG 15 STRL LF DISP TIS (BLADE) ×1 IMPLANT
BLADE SURG 15 STRL SS (BLADE) ×2
CANISTER SUCT 3000ML PPV (MISCELLANEOUS) ×1 IMPLANT
CANISTER SUCTION 1200CC (MISCELLANEOUS) IMPLANT
CATH ROBINSON RED A/P 16FR (CATHETERS) IMPLANT
GAUZE 4X4 16PLY RFD (DISPOSABLE) ×2 IMPLANT
GLOVE BIO SURGEON STRL SZ7.5 (GLOVE) ×4 IMPLANT
GOWN STRL REUS W/TWL XL LVL3 (GOWN DISPOSABLE) ×2 IMPLANT
KIT TURNOVER CYSTO (KITS) ×2 IMPLANT
NDL HYPO 25X1 1.5 SAFETY (NEEDLE) ×1 IMPLANT
NEEDLE HYPO 25X1 1.5 SAFETY (NEEDLE) ×2 IMPLANT
NS IRRIG 500ML POUR BTL (IV SOLUTION) ×1 IMPLANT
PACK VAGINAL WOMENS (CUSTOM PROCEDURE TRAY) ×2 IMPLANT
PAD OB MATERNITY 4.3X12.25 (PERSONAL CARE ITEMS) ×2 IMPLANT
SCOPETTES 8  STERILE (MISCELLANEOUS) ×2
SCOPETTES 8 STERILE (MISCELLANEOUS) ×2 IMPLANT
SUT VIC AB 3-0 SH 27 (SUTURE) ×2
SUT VIC AB 3-0 SH 27X BRD (SUTURE) ×1 IMPLANT
TOWEL OR 17X24 6PK STRL BLUE (TOWEL DISPOSABLE) ×4 IMPLANT
WATER STERILE IRR 500ML POUR (IV SOLUTION) ×1 IMPLANT

## 2018-07-19 NOTE — Transfer of Care (Signed)
Immediate Anesthesia Transfer of Care Note  Patient: [name]  Procedure(s) Performed: Janine Ores (N/A Vagina )  Patient Location: PACU  Anesthesia Type:General  Level of Consciousness: awake, alert  and oriented  Airway & Oxygen Therapy: Patient Spontanous Breathing and Patient connected to nasal cannula oxygen  Post-op Assessment: Report given to RN  Post vital signs: Reviewed and stable  Last Vitals:  Vitals Value Taken Time  BP 145/62 07/19/2018  2:30 PM  Temp    Pulse 98 07/19/2018  2:32 PM  Resp 18 07/19/2018  2:32 PM  SpO2 99 % 07/19/2018  2:32 PM  Vitals shown include unvalidated device data.  Last Pain:  Vitals:   07/19/18 1150  TempSrc: Oral         Complications: No apparent anesthesia complications

## 2018-07-19 NOTE — Interval H&P Note (Signed)
History and Physical Interval Note:  07/19/2018 1:37 PM  [name]  has presented today for surgery, with the diagnosis of RECURRENT ENDOMETRIAL CANCER  The various methods of treatment have been discussed with the patient and family. After consideration of risks, benefits and other options for treatment, the patient has consented to  Procedure(s): VAGINECTOMY,PARTIAL (N/A) as a surgical intervention .  The patient's history has been reviewed, patient examined, no change in status, stable for surgery.  I have reviewed the patient's chart and labs.  Questions were answered to the patient's satisfaction.     [name]

## 2018-07-19 NOTE — Discharge Instructions (Signed)

## 2018-07-19 NOTE — Anesthesia Procedure Notes (Signed)
Procedure Name: LMA Insertion Date/Time: 07/19/2018 1:52 PM Performed by: Bonney Aid, CRNA Pre-anesthesia Checklist: Patient identified, Emergency Drugs available, Suction available and Patient being monitored Patient Re-evaluated:Patient Re-evaluated prior to induction Oxygen Delivery Method: Circle system utilized Preoxygenation: Pre-oxygenation with 100% oxygen Induction Type: IV induction Ventilation: Mask ventilation without difficulty LMA: LMA inserted LMA Size: 4.0 Number of attempts: 1 Airway Equipment and Method: Bite block Placement Confirmation: positive ETCO2 Tube secured with: Tape Dental Injury: Teeth and Oropharynx as per pre-operative assessment

## 2018-07-19 NOTE — Anesthesia Preprocedure Evaluation (Addendum)
Anesthesia Evaluation  Patient identified by MRN, date of birth, ID band Patient awake    Reviewed: Allergy & Precautions, NPO status , Patient's Chart, lab work & pertinent test results  History of Anesthesia Complications (+) PONV and history of anesthetic complications  Airway Mallampati: II  TM Distance: >3 FB Neck ROM: Full    Dental no notable dental hx. (+) Partial Upper, Partial Lower, Dental Advisory Given   Pulmonary neg pulmonary ROS,  Bilateral pulmonary nodules   Pulmonary exam normal breath sounds clear to auscultation       Cardiovascular hypertension, Pt. on medications (-) angina(-) DOE negative cardio ROS Normal cardiovascular exam Rhythm:Regular Rate:Normal  TTE 2016: Mild LVH with LVEF 99-83%, grade 1 diastolic dysfunction, moderately sclerotic aortic valve without stenosis.    Hx of RBBB    Neuro/Psych negative neurological ROS  negative psych ROS   GI/Hepatic negative GI ROS, Neg liver ROS,   Endo/Other  negative endocrine ROSdiabetes, Type 2, Oral Hypoglycemic AgentsObesity, BMI 32.8  Renal/GU negative Renal ROS  negative genitourinary   Musculoskeletal negative musculoskeletal ROS (+)   Abdominal   Peds negative pediatric ROS (+)  Hematology negative hematology ROS (+)   Anesthesia Other Findings   Reproductive/Obstetrics negative OB ROS                            Anesthesia Physical Anesthesia Plan  ASA: II  Anesthesia Plan: General   Post-op Pain Management:    Induction: Intravenous  PONV Risk Score and Plan: 4 or greater and Treatment may vary due to age or medical condition, Ondansetron, Dexamethasone and Scopolamine patch - Pre-op  Airway Management Planned: LMA  Additional Equipment:   Intra-op Plan:   Post-operative Plan: Extubation in OR  Informed Consent: I have reviewed the patients History and Physical, chart, labs and discussed the  procedure including the risks, benefits and alternatives for the proposed anesthesia with the patient or authorized representative who has indicated his/her understanding and acceptance.   Dental advisory given  Plan Discussed with: CRNA and Surgeon  Anesthesia Plan Comments:        Anesthesia Quick Evaluation

## 2018-07-19 NOTE — Anesthesia Postprocedure Evaluation (Signed)
Anesthesia Post Note  Patient: [name]  Procedure(s) Performed: Janine Ores (N/A Vagina )     Patient location during evaluation: PACU Anesthesia Type: General Level of consciousness: awake and alert Pain management: pain level controlled Vital Signs Assessment: post-procedure vital signs reviewed and stable Respiratory status: spontaneous breathing, nonlabored ventilation and respiratory function stable Cardiovascular status: blood pressure returned to baseline and stable Postop Assessment: no apparent nausea or vomiting Anesthetic complications: no    Last Vitals:  Vitals:   07/19/18 1430 07/19/18 1445  BP: (!) 145/62 (!) 154/71  Pulse: (!) 105 96  Resp: 12 (!) 21  Temp: 36.9 C   SpO2: 99% 97%    Last Pain:  Vitals:   07/19/18 1430  TempSrc:   PainSc: 0-No pain                 Brennan Bailey

## 2018-07-19 NOTE — Procedures (Signed)
[name]  female MEDICAL RECORD MQ:[id number] DATE OF BIRTH: 05-11-1951 PHYSICIAN: [name], M.D nnn 07/19/2018   OPERATIVE REPORT  PREOPERATIVE DIAGNOSIS: Recurrent endometrial cancer at vaginal apex  POSTOPERATIVE DIAGNOSIS: same  PROCEDURE: upper vaginectomy:Examination under anesthesia, excision of lesion in upper vagina (upper vaginectomy), placement of radiation therapy markers.  SURGEON: [name], M.D   ANESTHESIA: LMA ESTIMATED BLOOD LOSS: 20 ml  SURGICAL FINDINGS: polypoid 3cm lesion at the left vaginal apex  PROCEDURE: Patient was brought to the operating room and after satisfactory attainment of LMA anesthesia was placed in lithotomy position in [name].  The perineum and vagina were prepped with Betadine and the patient was draped.  Surgical timeout was taken.  Using a bivalved speculum the upper vagina was exposed revealing the lesion at the left upper vaginal apex.  Using electrocautery the lesion was excised and submitted to pathology.  The bed of the excisional site was cauterized and Monsel's solution was also applied to achieve hemostasis.  At the completion of the procedure no gross residual disease was present.  3 markers were then inserted into the upper vagina for radiation therapy planning.  The patient was awakened from anesthesia and taken to the recovery room in satisfactory condition.  Sponge needle isthmic counts correct x2.   [name], M.D

## 2018-07-20 ENCOUNTER — Telehealth: Payer: Self-pay | Admitting: Oncology

## 2018-07-20 NOTE — Telephone Encounter (Signed)
Left a message for [facility] regarding her biopsy results.  Requested a return call.

## 2018-07-20 NOTE — Telephone Encounter (Signed)
Called Ashby and she said she is feeling good and even went back to work today.  Advised her of the results from her biopsy yesterday per Joylene John, NP.  She verbalized understanding and agreement and asked when she will be starting radiation.  Advised her that I will check to see when she can be scheduled for an appointment.

## 2018-07-21 ENCOUNTER — Encounter: Payer: Self-pay | Admitting: Radiation Oncology

## 2018-07-25 ENCOUNTER — Telehealth: Payer: Self-pay | Admitting: *Deleted

## 2018-07-25 NOTE — Telephone Encounter (Signed)
Medical records faxed to [facility] and [place]; release [phone]

## 2018-07-27 ENCOUNTER — Telehealth: Payer: Self-pay | Admitting: *Deleted

## 2018-07-27 NOTE — Telephone Encounter (Signed)
CALLED PATIENT TO INFORM OF [facility] APPT., LVM FOR A RETURN CALL

## 2018-07-29 NOTE — Progress Notes (Signed)
GYN Location of Tumor / Histology:Endometrial  Kathleen W Cobbpresented with symptoms of: 05/26/18:Patient called stating "I started bleeding yesterday. Yesterday was a menstrual type bleeding, it was bright red blood. Today is just spotting, it is pink. I'm not having any pain or cramping. I'm also raw down there. I have not done anything new like heavy lifting, execrise or intercourse. The only thing I can think of but doubt that is what is causing this is my PCP changed my diabetes medicines." Per Lenna Sciara APP patient scheduled to see Dr. Gerarda Fraction this afternoon.  Biopsies revealed:New biopsies pending. 05/26/18 biopsy inconclusive. Biopsy from 06/07/18 pending.  Past/Anticipated interventions by Gyn/Onc surgery, if any:Per Dr. Fermin Schwab 06/21/18:Assessment and plan: New lesion in the upper vagina suspicious for recurrent cancer.Pap smear and biopsy of the upper vaginal mass is obtained. Given the very high probability this recurrent cancer we will schedule the patient to see Dr. Kary Kos his opinion as to whether radiation therapy is an option. It is possible that I could excise the great majority of the upper vaginal lesion excising the majority of tumor volume and then allowing Dr. Alwyn Pea do some vaginal brach therapy. If radiation therapy is not possible, I might consider performing a more radical upper vaginectomy (given that CT scan shows no evidence of other metastatic sites). We will contact the patient with the Pap smear and biopsy report as soon as it is available.  PROCEDURE:upper vaginectomy:Examination under anesthesia, excision of lesion in upper vagina (upper vaginectomy), placement of radiation therapy markers.  SURGEON: [name], M.D HPI In 2003 the patient underwent abdominal hysterectomy for a stage IB grade 1 endometrial carcinoma. With good prognostic features she had no subsequent therapy. However, in September 2005, to develop recurrent  disease with metastases in the vagina as well as pulmonary metastases. These were biopsied. She was then treated with 6 cycles of carboplatin and Taxol had a partial response. Subsequently she was placed on Megace 40 mg 3 times a day. She then had a complete response with resolution of all pulmonary nodules. She's continued to take Megace for subsequent 10 years. She also received pelvic radiation therapy to treat the vaginal recurrence.  Past/Anticipated interventions by medical oncology, if AXE:NMMH at this time.  Weight changes, if any:Pt denies any weight changes  Bowel/Bladder complaints, if any: No  Pt reports occasional mucus-like vaginal discharge.   Nausea/Vomiting, if any:Pt denies.  Pain issues, if any:Pt denies c/o pain.  SAFETY ISSUES:  Prior radiation?Yes, pelvic radiation in 2006  Pacemaker/ICD?No  Possiblecurrent pregnancy?N/A, pt is post-surgical  Is the patient on methotrexate?No  Current Complaints/other details:  Pt presents today for f/u new appt with Dr. Sondra Come to discuss Radiation Oncology options, s/p procedure by Dr. Fermin Schwab. Pt is unaccompanied in exam room.   BP (!) 165/74 (BP Location: Left Arm, Patient Position: Sitting)   Pulse 82   Temp 98.4 F (36.9 C) (Oral)   Resp 20   Wt 200 lb 6.4 oz (90.9 kg)   SpO2 99%   BMI 33.35 kg/m   Wt Readings from Last 3 Encounters:  08/02/18 200 lb 6.4 oz (90.9 kg)  08/02/18 199 lb (90.3 kg)  07/19/18 196 lb 4.8 oz (89 kg)    Loma Sousa, RN BSN

## 2018-08-02 ENCOUNTER — Encounter: Payer: Self-pay | Admitting: Gynecology

## 2018-08-02 ENCOUNTER — Ambulatory Visit
Admission: RE | Admit: 2018-08-02 | Discharge: 2018-08-02 | Disposition: A | Discharge status: Home / Self Care | Payer: 59 | Source: Ambulatory Visit | Attending: Radiation Oncology | Admitting: Radiation Oncology

## 2018-08-02 ENCOUNTER — Inpatient Hospital Stay: Payer: 59 | Attending: Obstetrics | Admitting: Gynecology

## 2018-08-02 ENCOUNTER — Encounter: Payer: Self-pay | Admitting: Radiation Oncology

## 2018-08-02 ENCOUNTER — Other Ambulatory Visit: Payer: Self-pay

## 2018-08-02 VITALS — BP 165/74 | HR 82 | Temp 98.4°F | Resp 20 | Wt 200.4 lb

## 2018-08-02 VITALS — BP 160/64 | HR 88 | Temp 98.1°F | Resp 18 | Ht 65.0 in | Wt 199.0 lb

## 2018-08-02 DIAGNOSIS — C541 Malignant neoplasm of endometrium: Secondary | ICD-10-CM | POA: Insufficient documentation

## 2018-08-02 DIAGNOSIS — C7982 Secondary malignant neoplasm of genital organs: Secondary | ICD-10-CM | POA: Diagnosis not present

## 2018-08-02 DIAGNOSIS — Z7984 Long term (current) use of oral hypoglycemic drugs: Secondary | ICD-10-CM | POA: Diagnosis not present

## 2018-08-02 DIAGNOSIS — Z79899 Other long term (current) drug therapy: Secondary | ICD-10-CM | POA: Insufficient documentation

## 2018-08-02 DIAGNOSIS — Z90710 Acquired absence of both cervix and uterus: Secondary | ICD-10-CM | POA: Diagnosis not present

## 2018-08-02 DIAGNOSIS — Z90722 Acquired absence of ovaries, bilateral: Secondary | ICD-10-CM | POA: Insufficient documentation

## 2018-08-02 DIAGNOSIS — Z7982 Long term (current) use of aspirin: Secondary | ICD-10-CM | POA: Diagnosis not present

## 2018-08-02 DIAGNOSIS — Z888 Allergy status to other drugs, medicaments and biological substances status: Secondary | ICD-10-CM | POA: Insufficient documentation

## 2018-08-02 DIAGNOSIS — Z9889 Other specified postprocedural states: Secondary | ICD-10-CM | POA: Diagnosis not present

## 2018-08-02 NOTE — Progress Notes (Signed)
Radiation Oncology         [phone] ________________________________  Name: [name] MRN: [id number]  Date: 08/02/2018  DOB: 03/06/1951  Re-evaluation note  CC: Kathleen Samples, PA-C  [name]    ICD-10-CM   1. Endometrial cancer ([name]) C54.1     Diagnosis:   Recurrent endomencer  Interval Since Last Radiation:  13 years and 4 months   Narrative:  The patient returns today for further evaluation.  On October 8 the patient was taken to the operating room by Dr. Carlena [name]. She underwent  exam under anesthesia and excision of a lesion in the upper vaginal area. She then had placement of fiducial markers in preparation for radiation treatment.  surgical finngs were significant for a 3 cm polypoid lesion at the left vaginal apex Lesion was removed with electrocautery. The bed of the excision site was cauterized and Monsel's were placed to achieve homeostasis. At the completion of the procedure there was no gross residual tumor present. 3 fiducial markers were placed. Pathology from this procedure revealed: Adenocarcinoma with squamous differentiation.  Earlier today the patient was seen by Dr. Fermin [name]. A pelvic exam revealed the area to be healing well. She is now seen to plan her upcoming external beam radiation therapy and limited brachytherapy treatments.                         ALLERGIES:  is allergic to compazine.  Meds: Current Outpatient Medications  Medication Sig Dispense Refill  . aspirin EC 81 MG tablet Take 81 mg by mouth daily.    . Calcium Carb-Cholecalciferol (CALCIUM 500+D PO) Take 1 tablet by mouth daily.    . cholecalciferol (VITAMIN D) 1000 UNITS tablet Take 1,000 Units by mouth daily.    Marland Kitchen CINNAMON PO Take 1,000 mg by mouth daily.     . Garlic 1761 MG CAPS Take 1 capsule by mouth daily.     Marland Kitchen GLIPIZIDE XL PO Take 10 mg by mouth daily.     Marland Kitchen lisinopril (PRINIVIL,ZESTRIL) 20 MG tablet Take 20 mg by mouth daily.    .  megestrol (MEGACE) 40 MG tablet Take 1 tablet (40 mg total) by mouth 3 (three) times daily. 90 tablet 3  . metFORMIN (GLUCOPHAGE) 1000 MG tablet Take 1,000 mg by mouth 2 (two) times daily with a meal.     . Multiple Vitamin (MULTI VITAMIN DAILY PO) Take 1 each by mouth daily.    . NON FORMULARY Dark Cherry Extract    . Omega-3 Fatty Acids (FISH OIL) 1000 MG CAPS Take 1 each by mouth daily.     . simvastatin (ZOCOR) 20 MG tablet Take 20 mg by mouth every evening.     . Turmeric 500 MG CAPS Take 1 capsule by mouth daily.     . vitamin C (ASCORBIC ACID) 500 MG tablet Take 500 mg by mouth daily.     No current facility-administered medications for this encounter.     Physical Findings: The patient is in no acute distress. Patient is alert and oriented.  weight is 200 lb 6.4 oz (90.9 kg). Her oral temperature is 98.4 F (36.9 C). Her blood pressure is 165/74 (abnormal) and her pulse is 82. Her respiration is 20 and oxygen saturation is 99%. .  Lungs are clear to auscultation bilaterally. Heart has regular rate and rhythm. No palpable cervical, supraclavicular, or axillary adenopathy. Abdomen soft, non-tender, normal bowel sounds. Pelvic exam not performed in  light of her exam earlier today by gynecologic oncology.  Lab Findings: Lab Results  Component Value Date   HGB 12.6 07/19/2018   HCT 37.0 07/19/2018    Radiographic Findings: No results found.  Impression:  Recurrent endometrial cancer. The patient has had removal of the tumor bulk within the upper vaginal area. She will now proceed with planning for her radiation therapy. Today, I talked to the patient and  about the findings and work-up thus far.  We discussed the natural history of endometrial cancer and general treatment, highlighting the role of radiotherapy in the management.  We discussed the available radiation techniques, and focused on the details of logistics and delivery.  We reviewed the anticipated acute and late sequelae  associated with radiation in this setting.  The patient was encouraged to ask questions that I answered to the best of my ability.  A patient consent form was discussed and signed.  We retained a copy for our records.  The patient would like to proceed with radiation.  Plan:  Patient will be scheduled for CT simulation in the near future. I anticipate 5 weeks of external beam radiation therapy directed at the area of recurrence. The patient will then proceed with one to 2 intracavitary brachytherapy treatments.  ____________________________________ Gery Pray, MD

## 2018-08-02 NOTE — Progress Notes (Signed)
Consult Note: Gyn-Onc   [name] 67 y.o. female  Chief Complaint  Patient presents with  . Endometrial cancer ([place])    Assessment and plan: Recurrent endometrial carcinoma in the vaginal apex.  Status post excision 2 weeks ago.  Patient is scheduled to see Dr. Sondra Come later today for simulation. Interval History:  The patient returns today for postoperative checkup.  2 weeks ago she underwent excision of the exophytic lesion in the left upper vagina.  At the same time 3 markers were placed in the vaginal mucosa for radiation treatment planning.  Patient reports that she has had an uncomplicated postoperative course.  She has not had any bleeding.  She has a small amount of vaginal discharge.  She indicates that she has had  no pain.  Overall she feels well.   HPI  In 2003 the patient underwent abdominal hysterectomy for a stage IB grade 1 endometrial carcinoma. With good prognostic features she had no subsequent therapy. 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 These were biopsied. She was then treated with 6 cycles of carboplatin and Taxol had a partial response. Subsequently she was placed on Megace 40 mg 3 times a day. She then had a complete response with resolution of all pulmonary nodules. She's continued to take Megace for subsequent 10 years. She also received pelvic radiation therapy to treat the vaginal recurrence.  September 2019 the patient presented with a exophytic vaginal recurrence.  CT scan showed no other evidence of metastatic disease.  Review of Systems:10 point review of systems is negative as noted above.   Vitals: Blood pressure (!) 160/64, pulse 88, temperature 98.1 F (36.7 C), temperature source Oral, resp. rate 18, height 5\' 5"  (1.651 m), weight 199 lb (90.3 kg), SpO2 99 %.  Physical Exam: General : The patient is a healthy obese woman in no acute distress.  HEENT: normocephalic,  extraoccular movements normal; neck is supple without thyromegally  Lynphnodes: Supraclavicular and inguinal nodes not enlarged Breasts: Without masses skin changes or discharge.  Abdomen: Soft, non-tender, no ascites, no organomegally, no masses, no hernias  Pelvic:  EGBUS: Normal female  Vagina: The upper vagina has some gray-brownish tissue approximately 2 cm in diameter at the apex in the location where I excised the exophytic tumor 2 weeks ago.  There is no other evidence of metastatic disease. Urethra and Bladder: Normal, non-tender  Cervix: Surgically absent  Uterus: Surgically absent  Bi-manual examination: Non-tender; no adenxal masses or nodularity   d  Lower extremities: No edema or varicosities. Normal range of motion  Procedure note: After obtaining verbal informed consent and taken to timeout biopsy is obtained from the polypoid lesion.  Hemostasis achieved with silver nitrate.    Allergies  Allergen Reactions  . Compazine Other (See Comments)    Pt does not want med due to strange side effects     Past Medical History:  Diagnosis Date  . Aortic atherosclerosis ([place]) 05/2018  . Aortic valve sclerosis 01/14/2015   Moderate without stenosis, noted on ECHO  . Diabetes mellitus   . Diverticulosis 12/18/2016   Mild, noted on colonoscopy  . Endometrial cancer ([place])    Metastatic, Stage IB grade 1, recurrent  . Grade I diastolic dysfunction 73/71/0626   noted on ECHO  . Heart murmur   . History of cardiomegaly 2003  . History of colon polyps   . Hypertension   . Internal hemorrhoids 12/18/2016  noted on colonoscopy  . LAE (left atrial enlargement) 01/14/2015   Mild, noted on ECHO  . Lung cancer ([place])   . LVH (left ventricular hypertrophy) 01/24/2015   Mild, noted on ECHO  . Mass of right lung   . Obesity   . Pneumothorax on right 2005   History of   . PONV (postoperative nausea and vomiting)   . Pulmonary nodules    Bilateral  . RBBB (right bundle branch  block)   . Renal cyst, left    pt unaware  . Spondylosis    Mild, lumbar  . Wears contact lenses   . Wears glasses   . Wears partial dentures    upper and lower    Past Surgical History:  Procedure Laterality Date  . ABDOMINAL HYSTERECTOMY  04/2002   TAH  . BILATERAL OOPHORECTOMY  1998  . BREAST SURGERY     reduction  . CESAREAN SECTION     x2  . CHOLECYSTECTOMY    . COLONOSCOPY N/A 12/18/2016   Procedure: COLONOSCOPY;  Surgeon: Danie Binder, MD;  Location: [facility];  Service: Endoscopy;  Laterality: N/A;  9:45 AM  . REDUCTION MAMMAPLASTY Bilateral   . thoracic lung biopsy Left   . TONSILLECTOMY      Current Outpatient Medications  Medication Sig Dispense Refill  . aspirin EC 81 MG tablet Take 81 mg by mouth daily.    . Calcium Carb-Cholecalciferol (CALCIUM 500+D PO) Take 1 tablet by mouth daily.    . cholecalciferol (VITAMIN D) 1000 UNITS tablet Take 1,000 Units by mouth daily.    Marland Kitchen CINNAMON PO Take 1,000 mg by mouth daily.     . Garlic 9381 MG CAPS Take 1 capsule by mouth daily.     Marland Kitchen GLIPIZIDE XL PO Take 10 mg by mouth daily.     Marland Kitchen lisinopril (PRINIVIL,ZESTRIL) 20 MG tablet Take 20 mg by mouth daily.    . megestrol (MEGACE) 40 MG tablet Take 1 tablet (40 mg total) by mouth 3 (three) times daily. 90 tablet 3  . metFORMIN (GLUCOPHAGE) 1000 MG tablet Take 1,000 mg by mouth 2 (two) times daily with a meal.     . Multiple Vitamin (MULTI VITAMIN DAILY PO) Take 1 each by mouth daily.    . NON FORMULARY Dark Cherry Extract    . Omega-3 Fatty Acids (FISH OIL) 1000 MG CAPS Take 1 each by mouth daily.     . simvastatin (ZOCOR) 20 MG tablet Take 20 mg by mouth every evening.     . Turmeric 500 MG CAPS Take 1 capsule by mouth daily.     . vitamin C (ASCORBIC ACID) 500 MG tablet Take 500 mg by mouth daily.     No current facility-administered medications for this visit.     Social History   Socioeconomic History  . Marital status: Divorced    Spouse name: Not on file   . Number of children: Not on file  . Years of education: Not on file  . Highest education level: Not on file  Occupational History  . Not on file  Social Needs  . Financial resource strain: Not on file  . Food insecurity:    Worry: Not on file    Inability: Not on file  . Transportation needs:    Medical: Not on file    Non-medical: Not on file  Tobacco Use  . Smoking status: Never Smoker  . Smokeless tobacco: Never Used  Substance and Sexual Activity  .  Alcohol use: No  . Drug use: No  . Sexual activity: Not Currently    Birth control/protection: Surgical  Lifestyle  . Physical activity:    Days per week: Not on file    Minutes per session: Not on file  . Stress: Not on file  Relationships  . Social connections:    Talks on phone: Not on file    Gets together: Not on file    Attends religious service: Not on file    Active member of club or organization: Not on file    Attends meetings of clubs or organizations: Not on file    Relationship status: Not on file  . Intimate partner violence:    Fear of current or ex partner: Not on file    Emotionally abused: Not on file    Physically abused: Not on file    Forced sexual activity: Not on file  Other Topics Concern  . Not on file  Social History Narrative  . Not on file    Family History  Problem Relation Age of Onset  . Ovarian cancer Mother   . Heart Problems Father   . Coronary artery disease Brother       [name], MD 08/02/2018, 11:55 AM

## 2018-08-02 NOTE — Patient Instructions (Signed)
Plan to follow up with Dr. Fermin Schwab one month after finishing up radiation.

## 2018-08-03 ENCOUNTER — Ambulatory Visit: Admission: RE | Admit: 2018-08-03 | Payer: 59 | Source: Ambulatory Visit | Admitting: Radiation Oncology

## 2018-08-03 ENCOUNTER — Institutional Professional Consult (permissible substitution): Payer: Self-pay | Admitting: Radiation Oncology

## 2018-08-03 ENCOUNTER — Ambulatory Visit: Payer: 59

## 2018-08-09 ENCOUNTER — Ambulatory Visit
Admission: RE | Admit: 2018-08-09 | Discharge: 2018-08-09 | Disposition: A | Discharge status: Home / Self Care | Payer: 59 | Source: Ambulatory Visit | Attending: Radiation Oncology | Admitting: Radiation Oncology

## 2018-08-09 DIAGNOSIS — Z510 Encounter for antineoplastic radiation therapy: Secondary | ICD-10-CM | POA: Diagnosis not present

## 2018-08-09 DIAGNOSIS — C541 Malignant neoplasm of endometrium: Secondary | ICD-10-CM | POA: Diagnosis not present

## 2018-08-15 DIAGNOSIS — C541 Malignant neoplasm of endometrium: Secondary | ICD-10-CM | POA: Insufficient documentation

## 2018-08-15 DIAGNOSIS — Z510 Encounter for antineoplastic radiation therapy: Secondary | ICD-10-CM | POA: Insufficient documentation

## 2018-08-16 NOTE — Progress Notes (Signed)
  Radiation Oncology         [phone]) [phone] ________________________________  Name: [name] MRN: [id number]  Date: 08/09/2018  DOB: [birth year]  SIMULATION AND TREATMENT PLANNING NOTE    ICD-10-CM   1. Endometrial cancer ([place]) C54.1     DIAGNOSIS:  Recurrent endometrial cancer  NARRATIVE:  The patient was brought to the [facility].  Identity was confirmed.  All relevant records and images related to the planned course of therapy were reviewed.  The patient freely provided informed written consent to proceed with treatment after reviewing the details related to the planned course of therapy. The consent form was witnessed and verified by the simulation staff.  Then, the patient was set-up in a stable reproducible  supine position for radiation therapy.  CT images were obtained.  Surface markings were placed.  The CT images were loaded into the planning software.  Then the target and avoidance structures were contoured.  Treatment planning then occurred.  The radiation prescription was entered and confirmed.  Then, I designed and supervised the construction of a total of 2 medically necessary complex treatment devices.  I have requested : Intensity Modulated Radiotherapy (IMRT) is medically necessary for this case for the following reason:  Previous treatment to this area..  I have ordered:dose calc.  PLAN:  The patient will receive 45 Gy in 25 fractions followed by 2 intracavitary brachytherapy treatments directed at the proximal vagina using iridium 192 as the high-dose-rate source. (10 Gy/2 fx)  -----------------------------------  Blair Promise, PhD, MD

## 2018-08-17 NOTE — Progress Notes (Signed)
FMLA completed and mailed to patient address on file. No fax number provided to fax forms to.

## 2018-08-18 ENCOUNTER — Ambulatory Visit
Admission: RE | Admit: 2018-08-18 | Discharge: 2018-08-18 | Disposition: A | Discharge status: Home / Self Care | Payer: Medicare Other | Source: Ambulatory Visit | Attending: Radiation Oncology | Admitting: Radiation Oncology

## 2018-08-18 DIAGNOSIS — C541 Malignant neoplasm of endometrium: Secondary | ICD-10-CM | POA: Diagnosis not present

## 2018-08-18 DIAGNOSIS — Z510 Encounter for antineoplastic radiation therapy: Secondary | ICD-10-CM | POA: Diagnosis not present

## 2018-08-18 NOTE — Progress Notes (Signed)
  Radiation Oncology         [phone] ________________________________  Name: [name] MRN: [id number]  Date: 08/18/2018  DOB: [birth year]  Simulation Verification Note    ICD-10-CM   1. Endometrial cancer ([place]) C54.1     Status: outpatient  NARRATIVE: The patient was brought to the treatment unit and placed in the planned treatment position. The clinical setup was verified. Then port films were obtained and uploaded to the radiation oncology medical record software.  The treatment beams were carefully compared against the planned radiation fields. The position location and shape of the radiation fields was reviewed. They targeted volume of tissue appears to be appropriately covered by the radiation beams. Organs at risk appear to be excluded as planned.  Based on my personal review, I approved the simulation verification. The patient's treatment will proceed as planned.  -----------------------------------  Blair Promise, PhD, MD  This document serves as a record of services personally performed by Gery Pray, MD. It was created on his behalf by Wilburn Mylar, a trained medical scribe. The creation of this record is based on the scribe's personal observations and the provider's statements to them. This document has been checked and approved by the attending provider.

## 2018-08-19 ENCOUNTER — Ambulatory Visit
Admission: RE | Admit: 2018-08-19 | Discharge: 2018-08-19 | Disposition: A | Discharge status: Home / Self Care | Payer: Medicare Other | Source: Ambulatory Visit | Attending: Radiation Oncology | Admitting: Radiation Oncology

## 2018-08-19 DIAGNOSIS — Z510 Encounter for antineoplastic radiation therapy: Secondary | ICD-10-CM | POA: Diagnosis not present

## 2018-08-19 DIAGNOSIS — C541 Malignant neoplasm of endometrium: Secondary | ICD-10-CM | POA: Diagnosis not present

## 2018-08-22 ENCOUNTER — Ambulatory Visit
Admission: RE | Admit: 2018-08-22 | Discharge: 2018-08-22 | Disposition: A | Discharge status: Home / Self Care | Payer: Medicare Other | Source: Ambulatory Visit | Attending: Radiation Oncology | Admitting: Radiation Oncology

## 2018-08-22 DIAGNOSIS — Z510 Encounter for antineoplastic radiation therapy: Secondary | ICD-10-CM | POA: Diagnosis not present

## 2018-08-22 DIAGNOSIS — C541 Malignant neoplasm of endometrium: Secondary | ICD-10-CM | POA: Diagnosis not present

## 2018-08-23 ENCOUNTER — Ambulatory Visit
Admission: RE | Admit: 2018-08-23 | Discharge: 2018-08-23 | Disposition: A | Discharge status: Home / Self Care | Payer: Medicare Other | Source: Ambulatory Visit | Attending: Radiation Oncology | Admitting: Radiation Oncology

## 2018-08-23 DIAGNOSIS — C541 Malignant neoplasm of endometrium: Secondary | ICD-10-CM | POA: Diagnosis not present

## 2018-08-23 DIAGNOSIS — Z510 Encounter for antineoplastic radiation therapy: Secondary | ICD-10-CM | POA: Diagnosis not present

## 2018-08-24 ENCOUNTER — Ambulatory Visit
Admission: RE | Admit: 2018-08-24 | Discharge: 2018-08-24 | Disposition: A | Discharge status: Home / Self Care | Payer: Medicare Other | Source: Ambulatory Visit | Attending: Radiation Oncology | Admitting: Radiation Oncology

## 2018-08-24 DIAGNOSIS — Z510 Encounter for antineoplastic radiation therapy: Secondary | ICD-10-CM | POA: Diagnosis not present

## 2018-08-24 DIAGNOSIS — C541 Malignant neoplasm of endometrium: Secondary | ICD-10-CM | POA: Diagnosis not present

## 2018-08-25 ENCOUNTER — Ambulatory Visit
Admission: RE | Admit: 2018-08-25 | Discharge: 2018-08-25 | Disposition: A | Discharge status: Home / Self Care | Payer: Medicare Other | Source: Ambulatory Visit | Attending: Radiation Oncology | Admitting: Radiation Oncology

## 2018-08-25 DIAGNOSIS — C541 Malignant neoplasm of endometrium: Secondary | ICD-10-CM | POA: Diagnosis not present

## 2018-08-25 DIAGNOSIS — Z510 Encounter for antineoplastic radiation therapy: Secondary | ICD-10-CM | POA: Diagnosis not present

## 2018-08-26 ENCOUNTER — Ambulatory Visit
Admission: RE | Admit: 2018-08-26 | Discharge: 2018-08-26 | Disposition: A | Discharge status: Home / Self Care | Payer: Medicare Other | Source: Ambulatory Visit | Attending: Radiation Oncology | Admitting: Radiation Oncology

## 2018-08-26 DIAGNOSIS — Z510 Encounter for antineoplastic radiation therapy: Secondary | ICD-10-CM | POA: Diagnosis not present

## 2018-08-26 DIAGNOSIS — C541 Malignant neoplasm of endometrium: Secondary | ICD-10-CM | POA: Diagnosis not present

## 2018-08-28 DIAGNOSIS — M1712 Unilateral primary osteoarthritis, left knee: Secondary | ICD-10-CM | POA: Diagnosis not present

## 2018-08-29 ENCOUNTER — Ambulatory Visit
Admission: RE | Admit: 2018-08-29 | Discharge: 2018-08-29 | Disposition: A | Discharge status: Home / Self Care | Payer: Medicare Other | Source: Ambulatory Visit | Attending: Radiation Oncology | Admitting: Radiation Oncology

## 2018-08-29 DIAGNOSIS — C541 Malignant neoplasm of endometrium: Secondary | ICD-10-CM | POA: Diagnosis not present

## 2018-08-29 DIAGNOSIS — Z510 Encounter for antineoplastic radiation therapy: Secondary | ICD-10-CM | POA: Diagnosis not present

## 2018-08-30 ENCOUNTER — Ambulatory Visit
Admission: RE | Admit: 2018-08-30 | Discharge: 2018-08-30 | Disposition: A | Discharge status: Home / Self Care | Payer: Medicare Other | Source: Ambulatory Visit | Attending: Radiation Oncology | Admitting: Radiation Oncology

## 2018-08-30 DIAGNOSIS — C541 Malignant neoplasm of endometrium: Secondary | ICD-10-CM | POA: Diagnosis not present

## 2018-08-30 DIAGNOSIS — Z510 Encounter for antineoplastic radiation therapy: Secondary | ICD-10-CM | POA: Diagnosis not present

## 2018-08-31 ENCOUNTER — Ambulatory Visit
Admission: RE | Admit: 2018-08-31 | Discharge: 2018-08-31 | Disposition: A | Discharge status: Home / Self Care | Payer: Medicare Other | Source: Ambulatory Visit | Attending: Radiation Oncology | Admitting: Radiation Oncology

## 2018-08-31 DIAGNOSIS — Z510 Encounter for antineoplastic radiation therapy: Secondary | ICD-10-CM | POA: Diagnosis not present

## 2018-08-31 DIAGNOSIS — C541 Malignant neoplasm of endometrium: Secondary | ICD-10-CM | POA: Diagnosis not present

## 2018-09-01 ENCOUNTER — Ambulatory Visit
Admission: RE | Admit: 2018-09-01 | Discharge: 2018-09-01 | Disposition: A | Discharge status: Home / Self Care | Payer: Medicare Other | Source: Ambulatory Visit | Attending: Radiation Oncology | Admitting: Radiation Oncology

## 2018-09-01 DIAGNOSIS — Z510 Encounter for antineoplastic radiation therapy: Secondary | ICD-10-CM | POA: Diagnosis not present

## 2018-09-01 DIAGNOSIS — C541 Malignant neoplasm of endometrium: Secondary | ICD-10-CM | POA: Diagnosis not present

## 2018-09-02 ENCOUNTER — Ambulatory Visit
Admission: RE | Admit: 2018-09-02 | Discharge: 2018-09-02 | Disposition: A | Discharge status: Home / Self Care | Payer: Medicare Other | Source: Ambulatory Visit | Attending: Radiation Oncology | Admitting: Radiation Oncology

## 2018-09-02 DIAGNOSIS — Z510 Encounter for antineoplastic radiation therapy: Secondary | ICD-10-CM | POA: Diagnosis not present

## 2018-09-02 DIAGNOSIS — C541 Malignant neoplasm of endometrium: Secondary | ICD-10-CM | POA: Diagnosis not present

## 2018-09-04 ENCOUNTER — Ambulatory Visit
Admission: RE | Admit: 2018-09-04 | Discharge: 2018-09-04 | Disposition: A | Discharge status: Home / Self Care | Payer: Medicare Other | Source: Ambulatory Visit | Attending: Radiation Oncology | Admitting: Radiation Oncology

## 2018-09-04 DIAGNOSIS — Z510 Encounter for antineoplastic radiation therapy: Secondary | ICD-10-CM | POA: Diagnosis not present

## 2018-09-04 DIAGNOSIS — C541 Malignant neoplasm of endometrium: Secondary | ICD-10-CM | POA: Diagnosis not present

## 2018-09-05 ENCOUNTER — Ambulatory Visit
Admission: RE | Admit: 2018-09-05 | Discharge: 2018-09-05 | Disposition: A | Discharge status: Home / Self Care | Payer: Medicare Other | Source: Ambulatory Visit | Attending: Radiation Oncology | Admitting: Radiation Oncology

## 2018-09-05 DIAGNOSIS — C541 Malignant neoplasm of endometrium: Secondary | ICD-10-CM | POA: Diagnosis not present

## 2018-09-05 DIAGNOSIS — Z510 Encounter for antineoplastic radiation therapy: Secondary | ICD-10-CM | POA: Diagnosis not present

## 2018-09-06 ENCOUNTER — Ambulatory Visit
Admission: RE | Admit: 2018-09-06 | Discharge: 2018-09-06 | Disposition: A | Discharge status: Home / Self Care | Payer: Medicare Other | Source: Ambulatory Visit | Attending: Radiation Oncology | Admitting: Radiation Oncology

## 2018-09-06 DIAGNOSIS — Z510 Encounter for antineoplastic radiation therapy: Secondary | ICD-10-CM | POA: Diagnosis not present

## 2018-09-06 DIAGNOSIS — C541 Malignant neoplasm of endometrium: Secondary | ICD-10-CM | POA: Diagnosis not present

## 2018-09-07 ENCOUNTER — Ambulatory Visit
Admission: RE | Admit: 2018-09-07 | Discharge: 2018-09-07 | Disposition: A | Discharge status: Home / Self Care | Payer: Medicare Other | Source: Ambulatory Visit | Attending: Radiation Oncology | Admitting: Radiation Oncology

## 2018-09-07 DIAGNOSIS — Z510 Encounter for antineoplastic radiation therapy: Secondary | ICD-10-CM | POA: Diagnosis not present

## 2018-09-07 DIAGNOSIS — C541 Malignant neoplasm of endometrium: Secondary | ICD-10-CM | POA: Diagnosis not present

## 2018-09-12 ENCOUNTER — Ambulatory Visit
Admission: RE | Admit: 2018-09-12 | Discharge: 2018-09-12 | Disposition: A | Discharge status: Home / Self Care | Payer: Medicare Other | Source: Ambulatory Visit | Attending: Radiation Oncology | Admitting: Radiation Oncology

## 2018-09-12 DIAGNOSIS — Z510 Encounter for antineoplastic radiation therapy: Secondary | ICD-10-CM | POA: Diagnosis not present

## 2018-09-12 DIAGNOSIS — C541 Malignant neoplasm of endometrium: Secondary | ICD-10-CM | POA: Insufficient documentation

## 2018-09-13 ENCOUNTER — Ambulatory Visit
Admission: RE | Admit: 2018-09-13 | Discharge: 2018-09-13 | Disposition: A | Discharge status: Home / Self Care | Payer: Medicare Other | Source: Ambulatory Visit | Attending: Radiation Oncology | Admitting: Radiation Oncology

## 2018-09-13 DIAGNOSIS — Z510 Encounter for antineoplastic radiation therapy: Secondary | ICD-10-CM | POA: Diagnosis not present

## 2018-09-13 DIAGNOSIS — C541 Malignant neoplasm of endometrium: Secondary | ICD-10-CM | POA: Diagnosis not present

## 2018-09-14 ENCOUNTER — Ambulatory Visit
Admission: RE | Admit: 2018-09-14 | Discharge: 2018-09-14 | Disposition: A | Discharge status: Home / Self Care | Payer: Medicare Other | Source: Ambulatory Visit | Attending: Radiation Oncology | Admitting: Radiation Oncology

## 2018-09-14 ENCOUNTER — Telehealth: Payer: Self-pay | Admitting: *Deleted

## 2018-09-14 DIAGNOSIS — C541 Malignant neoplasm of endometrium: Secondary | ICD-10-CM | POA: Diagnosis not present

## 2018-09-14 DIAGNOSIS — R300 Dysuria: Secondary | ICD-10-CM

## 2018-09-14 DIAGNOSIS — Z510 Encounter for antineoplastic radiation therapy: Secondary | ICD-10-CM | POA: Diagnosis not present

## 2018-09-14 LAB — URINALYSIS, COMPLETE (UACMP) WITH MICROSCOPIC
Bilirubin Urine: NEGATIVE
Glucose, UA: NEGATIVE mg/dL
HGB URINE DIPSTICK: NEGATIVE
Ketones, ur: 5 mg/dL — AB
Nitrite: NEGATIVE
PROTEIN: 100 mg/dL — AB
SPECIFIC GRAVITY, URINE: 1.028 (ref 1.005–1.030)
pH: 5 (ref 5.0–8.0)

## 2018-09-14 NOTE — Telephone Encounter (Signed)
CALLED PATIENT TO INFORM OF LAB APPT. FOR 09-14-18 @ 3:15 PM, LVM FOR A RETURN CALL

## 2018-09-15 ENCOUNTER — Ambulatory Visit
Admission: RE | Admit: 2018-09-15 | Discharge: 2018-09-15 | Disposition: A | Discharge status: Home / Self Care | Payer: Medicare Other | Source: Ambulatory Visit | Attending: Radiation Oncology | Admitting: Radiation Oncology

## 2018-09-15 DIAGNOSIS — C541 Malignant neoplasm of endometrium: Secondary | ICD-10-CM | POA: Diagnosis not present

## 2018-09-15 DIAGNOSIS — Z510 Encounter for antineoplastic radiation therapy: Secondary | ICD-10-CM | POA: Diagnosis not present

## 2018-09-16 ENCOUNTER — Ambulatory Visit
Admission: RE | Admit: 2018-09-16 | Discharge: 2018-09-16 | Disposition: A | Discharge status: Home / Self Care | Payer: Medicare Other | Source: Ambulatory Visit | Attending: Radiation Oncology | Admitting: Radiation Oncology

## 2018-09-16 DIAGNOSIS — Z510 Encounter for antineoplastic radiation therapy: Secondary | ICD-10-CM | POA: Diagnosis not present

## 2018-09-16 DIAGNOSIS — C541 Malignant neoplasm of endometrium: Secondary | ICD-10-CM | POA: Diagnosis not present

## 2018-09-16 LAB — URINE CULTURE: Culture: <10000 — AB

## 2018-09-19 ENCOUNTER — Ambulatory Visit
Admission: RE | Admit: 2018-09-19 | Discharge: 2018-09-19 | Disposition: A | Discharge status: Home / Self Care | Payer: Medicare Other | Source: Ambulatory Visit | Attending: Radiation Oncology | Admitting: Radiation Oncology

## 2018-09-19 DIAGNOSIS — Z510 Encounter for antineoplastic radiation therapy: Secondary | ICD-10-CM | POA: Diagnosis not present

## 2018-09-19 DIAGNOSIS — C541 Malignant neoplasm of endometrium: Secondary | ICD-10-CM | POA: Diagnosis not present

## 2018-09-20 ENCOUNTER — Ambulatory Visit
Admission: RE | Admit: 2018-09-20 | Discharge: 2018-09-20 | Disposition: A | Discharge status: Home / Self Care | Payer: Medicare Other | Source: Ambulatory Visit | Attending: Radiation Oncology | Admitting: Radiation Oncology

## 2018-09-20 DIAGNOSIS — C541 Malignant neoplasm of endometrium: Secondary | ICD-10-CM | POA: Diagnosis not present

## 2018-09-20 DIAGNOSIS — Z510 Encounter for antineoplastic radiation therapy: Secondary | ICD-10-CM | POA: Diagnosis not present

## 2018-09-21 ENCOUNTER — Ambulatory Visit
Admission: RE | Admit: 2018-09-21 | Discharge: 2018-09-21 | Disposition: A | Discharge status: Home / Self Care | Payer: Medicare Other | Source: Ambulatory Visit | Attending: Radiation Oncology | Admitting: Radiation Oncology

## 2018-09-21 ENCOUNTER — Telehealth: Payer: Self-pay | Admitting: *Deleted

## 2018-09-21 DIAGNOSIS — Z510 Encounter for antineoplastic radiation therapy: Secondary | ICD-10-CM | POA: Diagnosis not present

## 2018-09-21 DIAGNOSIS — C541 Malignant neoplasm of endometrium: Secondary | ICD-10-CM | POA: Diagnosis not present

## 2018-09-21 NOTE — Telephone Encounter (Signed)
Called patient to inform of New HDR [facility] for 09-27-18, no answer will call later

## 2018-09-22 ENCOUNTER — Ambulatory Visit: Payer: Medicare Other

## 2018-09-22 ENCOUNTER — Ambulatory Visit
Admission: RE | Admit: 2018-09-22 | Discharge: 2018-09-22 | Disposition: A | Discharge status: Home / Self Care | Payer: Medicare Other | Source: Ambulatory Visit | Attending: Radiation Oncology | Admitting: Radiation Oncology

## 2018-09-22 DIAGNOSIS — Z510 Encounter for antineoplastic radiation therapy: Secondary | ICD-10-CM | POA: Diagnosis not present

## 2018-09-22 DIAGNOSIS — C541 Malignant neoplasm of endometrium: Secondary | ICD-10-CM | POA: Diagnosis not present

## 2018-09-23 ENCOUNTER — Ambulatory Visit: Payer: Medicare Other

## 2018-09-26 ENCOUNTER — Telehealth: Payer: Self-pay | Admitting: *Deleted

## 2018-09-26 NOTE — Telephone Encounter (Signed)
Called patient to remind of New HDR [place] for 09-27-18, lvm for a return call

## 2018-09-27 ENCOUNTER — Other Ambulatory Visit: Payer: Self-pay

## 2018-09-27 ENCOUNTER — Ambulatory Visit
Admission: RE | Admit: 2018-09-27 | Discharge: 2018-09-27 | Disposition: A | Discharge status: Home / Self Care | Payer: Medicare Other | Source: Ambulatory Visit | Attending: Radiation Oncology | Admitting: Radiation Oncology

## 2018-09-27 ENCOUNTER — Encounter: Payer: Self-pay | Admitting: Radiation Oncology

## 2018-09-27 VITALS — BP 156/67 | HR 75 | Temp 98.2°F | Resp 18 | Ht 65.0 in | Wt 206.5 lb

## 2018-09-27 DIAGNOSIS — C541 Malignant neoplasm of endometrium: Secondary | ICD-10-CM

## 2018-09-27 DIAGNOSIS — Z510 Encounter for antineoplastic radiation therapy: Secondary | ICD-10-CM | POA: Diagnosis not present

## 2018-09-27 DIAGNOSIS — Z79899 Other long term (current) drug therapy: Secondary | ICD-10-CM | POA: Diagnosis not present

## 2018-09-27 MED ORDER — PHENAZOPYRIDINE HCL 200 MG PO TABS: 200.0000 mg | ORAL_TABLET | Freq: Three times a day (TID) | ORAL | 0 refills | Status: DC | PRN

## 2018-09-27 NOTE — Progress Notes (Signed)
Pt presents today for new [facility] HDR with Dr. Sondra Come. Pt c/o burning with urination that is quite painful, 5/10 on pain score. Pt denies hematuria, vaginal bleeding/discharge. Pt denies rectal bleeding, diarrhea/constipation.   BP (!) 156/67 (BP Location: Left Arm, Patient Position: Sitting)   Pulse 75   Temp 98.2 F (36.8 C) (Oral)   Resp 18   Ht 5\' 5"  (1.651 m)   Wt 206 lb 8 oz (93.7 kg)   SpO2 100%   BMI 34.36 kg/m   Wt Readings from Last 3 Encounters:  09/27/18 206 lb 8 oz (93.7 kg)  08/02/18 200 lb 6.4 oz (90.9 kg)  08/02/18 199 lb (90.3 kg)   Loma Sousa, RN BSN

## 2018-09-27 NOTE — Progress Notes (Signed)
  Radiation Oncology         [phone] ________________________________  Name: [name] MRN: [id number]  Date: 09/27/2018  DOB: 1951-08-19  CC: Jake Samples, PA-C  [name]  HDR BRACHYTHERAPY NOTE  DIAGNOSIS: recurrent endometrial cancer   Simple treatment device note: Patient had construction of her custom vaginal cylinder. She will be treated with a 2.0 cm diameter segmented cylinder. This conforms to her anatomy without undue discomfort.  Vaginal brachytherapy procedure node: The patient was brought to the [place] suite. Identity was confirmed. All relevant records and images related to the planned course of therapy were reviewed. The patient freely provided informed written consent to proceed with treatment after reviewing the details related to the planned course of therapy. The consent form was witnessed and verified by the simulation staff. Then, the patient was set-up in a stable reproducible supine position for radiation therapy. Pelvic exam revealed the vaginal cuff to be intact . The patient's custom vaginal cylinder was placed in the proximal vagina. This was affixed to the CT/MR stabilization plate to prevent slippage. Patient tolerated the placement well.  Verification simulation note:  A fiducial marker was placed within the vaginal cylinder. An AP and lateral film was then obtained through the pelvis area. This documented accurate position of the vaginal cylinder for treatment.  HDR BRACHYTHERAPY TREATMENT  The remote afterloading device was affixed to the vaginal cylinder by catheter. Patient then proceeded to undergo her first high-dose-rate treatment directed at the proximal vagina. The patient was prescribed a dose of 5.0 gray to be delivered to the mucosal surface. Treatment length was 2.5 cm. Patient was treated with 1 channel using 5 dwell positions. Treatment time was 122.9 seconds. Iridium 192 was the high-dose-rate source for treatment. The  patient tolerated the treatment well. After completion of her therapy, a radiation survey was performed documenting return of the iridium source into the [employer] safe.   PLAN: patient will return in early January for her second and final treatment. ________________________________  Blair Promise, PhD, MD

## 2018-09-27 NOTE — Progress Notes (Signed)
  Radiation Oncology         [phone] ________________________________  Name: [name] MRN: [id number]  Date: 09/27/2018  DOB: 1951-03-18  SIMULATION AND TREATMENT PLANNING NOTE HDR BRACHYTHERAPY  DIAGNOSIS:  Recurrent endometrial cancer  NARRATIVE:  The patient was brought to the [place] suite.  Identity was confirmed.  All relevant records and images related to the planned course of therapy were reviewed.  The patient freely provided informed written consent to proceed with treatment after reviewing the details related to the planned course of therapy. The consent form was witnessed and verified by the simulation staff.  Then, the patient was set-up in a stable reproducible  supine position for radiation therapy.  CT images were obtained.  Surface markings were placed.  The CT images were loaded into the planning software.  Then the target and avoidance structures were contoured.  Treatment planning then occurred.  The radiation prescription was entered and confirmed.   I have requested : Brachytherapy Isodose Plan and Dosimetry Calculations to plan the radiation distribution.    PLAN:  The patient will receive 10 Gy in 2 fractions.  The patient will be treated with a 2.0 cm diameter cylinder with a treatment length 2.5 cm. Iridium 192 will be the high-dose-rate source.    ________________________________  Blair Promise, PhD, MD

## 2018-09-27 NOTE — Progress Notes (Signed)
Radiation Oncology         [phone] ________________________________  Name: [name] MRN: [id number]  Date: 09/27/2018  DOB: 07/10/1951  Vaginal Brachytherapy Procedure Note  CC: Jake Samples, PA-C [name]    ICD-10-CM   1. Endometrial cancer ([place]) C54.1     Diagnosis: recurrent endometrial cancer    Narrative: She returns today for vaginal cylinder fitting. She has recently completed her external beam radiation therapy. She has had some problems with dysuria. Urine analysis showed no evidence of infection. She has tried over-the-counter AZO which has not been strong enough. She will be placed on Pyridium 200 mg 3 times a day for this issue.  ALLERGIES: is allergic to compazine.  Meds: Current Outpatient Medications  Medication Sig Dispense Refill  . aspirin EC 81 MG tablet Take 81 mg by mouth daily.    . Calcium Carb-Cholecalciferol (CALCIUM 500+D PO) Take 1 tablet by mouth daily.    . cholecalciferol (VITAMIN D) 1000 UNITS tablet Take 1,000 Units by mouth daily.    Marland Kitchen CINNAMON PO Take 1,000 mg by mouth daily.     . diclofenac (VOLTAREN) 50 MG EC tablet   0  . Garlic 2458 MG CAPS Take 1 capsule by mouth daily.     Marland Kitchen GLIPIZIDE XL PO Take 10 mg by mouth daily.     Marland Kitchen lisinopril (PRINIVIL,ZESTRIL) 20 MG tablet Take 20 mg by mouth daily.    . megestrol (MEGACE) 40 MG tablet Take 1 tablet (40 mg total) by mouth 3 (three) times daily. 90 tablet 3  . metFORMIN (GLUCOPHAGE) 1000 MG tablet Take 1,000 mg by mouth 2 (two) times daily with a meal.     . Multiple Vitamin (MULTI VITAMIN DAILY PO) Take 1 each by mouth daily.    . NON FORMULARY Dark Cherry Extract    . Omega-3 Fatty Acids (FISH OIL) 1000 MG CAPS Take 1 each by mouth daily.     . simvastatin (ZOCOR) 20 MG tablet Take 20 mg by mouth every evening.     . Turmeric 500 MG CAPS Take 1 capsule by mouth daily.     . vitamin C (ASCORBIC ACID) 500 MG tablet Take 500 mg by mouth daily.    .  phenazopyridine (PYRIDIUM) 200 MG tablet Take 1 tablet (200 mg total) by mouth 3 (three) times daily as needed for pain. 30 tablet 0   No current facility-administered medications for this encounter.     Physical Findings: The patient is in no acute distress. Patient is alert and oriented.  height is 5\' 5"  (1.651 m) and weight is 206 lb 8 oz (93.7 kg). Her oral temperature is 98.2 F (36.8 C). Her blood pressure is 156/67 (abnormal) and her pulse is 75. Her respiration is 18 and oxygen saturation is 100%.   No palpable cervical, supraclavicular or axillary lymphoadenopathy. The heart has a regular rate and rhythm. The lungs are clear to auscultation. Abdomen soft and non-tender.  On pelvic examination the external genitalia were unremarkable. A speculum exam was performed. Vaginal cuff intact, no mucosal lesions. On bimanual exam there were no pelvic masses appreciated.  Lab Findings: Lab Results  Component Value Date   HGB 12.6 07/19/2018   HCT 37.0 07/19/2018    Radiographic Findings: No results found.  Impression: recurrent endometrial cancer  Patient was fitted for a vaginal cylinder. The patient will be treated with a 2.0 cm diameter cylinder with a treatment length of 2.5 cm. This distended the  vaginal vault without undue discomfort. The patient tolerated the procedure well.  The patient was successfully fitted for a vaginal cylinder. The patient is appropriate to begin vaginal brachytherapy.   Plan: The patient will proceed with CT simulation and vaginal brachytherapy today.    _______________________________   Blair Promise, PhD, MD

## 2018-09-28 ENCOUNTER — Ambulatory Visit: Payer: Medicare Other

## 2018-10-11 ENCOUNTER — Telehealth: Payer: Self-pay | Admitting: *Deleted

## 2018-10-11 NOTE — Telephone Encounter (Signed)
Called patient to remind of HDR [place]. for 10-13-18 @ 10 am, lvm for a return call

## 2018-10-13 ENCOUNTER — Encounter: Payer: Self-pay | Admitting: Radiation Oncology

## 2018-10-13 ENCOUNTER — Ambulatory Visit
Admission: RE | Admit: 2018-10-13 | Discharge: 2018-10-13 | Disposition: A | Discharge status: Home / Self Care | Payer: Medicare Other | Source: Ambulatory Visit | Attending: Radiation Oncology | Admitting: Radiation Oncology

## 2018-10-13 DIAGNOSIS — Z510 Encounter for antineoplastic radiation therapy: Secondary | ICD-10-CM | POA: Insufficient documentation

## 2018-10-13 DIAGNOSIS — C541 Malignant neoplasm of endometrium: Secondary | ICD-10-CM | POA: Diagnosis not present

## 2018-10-13 NOTE — Progress Notes (Signed)
  Radiation Oncology         [phone]) [phone] ________________________________  Name: [name] MRN: [id number]  Date: 10/13/2018  DOB: Jun 09, 1951  CC: Jake Samples, PA-C  [name]  HDR BRACHYTHERAPY NOTE  DIAGNOSIS: 67 y.o. female with Recurrent endometrial cancer   Simple treatment device note: Patient had construction of her custom vaginal cylinder. She will be treated with a 2.0 cm diameter segmented cylinder. This conforms to her anatomy without undue discomfort.  Vaginal brachytherapy procedure node: The patient was brought to the [facility] suite. Identity was confirmed. All relevant records and images related to the planned course of therapy were reviewed. The patient freely provided informed written consent to proceed with treatment after reviewing the details related to the planned course of therapy. The consent form was witnessed and verified by the simulation staff. Then, the patient was set-up in a stable reproducible supine position for radiation therapy. Pelvic exam revealed the vaginal cuff to be intact. The patient's custom vaginal cylinder was placed in the proximal vagina. This was affixed to the CT/MR stabilization plate to prevent slippage. Patient tolerated the placement well.  Verification simulation note:  A fiducial marker was placed within the vaginal cylinder. An AP and lateral film was then obtained through the pelvis area. This documented accurate position of the vaginal cylinder for treatment.  HDR BRACHYTHERAPY TREATMENT  The remote afterloading device was affixed to the vaginal cylinder by catheter. Patient then proceeded to undergo her second and final high-dose-rate treatment directed at the proximal vagina. The patient was prescribed a dose of 5 gray to be delivered to the mucosal surface. Treatment length was 2.5 cm. Patient was treated with 1 channel using 5 dwell positions. Treatment time was 142.70 seconds. Iridium 192 was the high-dose-rate  source for treatment. The patient tolerated the treatment well. After completion of her therapy, a radiation survey was performed documenting return of the iridium source into the [employer] safe.   PLAN: The patient has completed radiation treatment. She will return to radiation oncology clinic for routine followup in two weeks. ________________________________  Blair Promise, PhD, MD   This document serves as a record of services personally performed by Gery Pray, MD. It was created on his behalf by Rae Lips, a trained medical scribe. The creation of this record is based on the scribe's personal observations and the provider's statements to them. This document has been checked and approved by the attending provider.

## 2018-10-20 NOTE — Progress Notes (Signed)
  Radiation Oncology         [phone]) [phone] ________________________________  Name: [name] MRN: [id number]  Date: 10/13/2018  DOB: 1951-09-16  End of Treatment Note  Diagnosis:   68 y.o. female with Recurrent endometrial cancer    Indication for treatment:  Curative     Radiation treatment dates:    1. IMRT central Pelvis: 08/18/2018 - 09/22/2018 2. HDR: 09/27/2018 and 10/13/2018  Site/dose:    1. Pelvis / 45 Gy in 25 fractions 2. Vagina / 10 Gy in 2 fractions  Beams/energy:    1. IMRT / 6X Photon 2. HDR Ir-192 Vaginal Brachytherapy  Narrative: The patient tolerated radiation treatment relatively well.  Towards the end of IMRT treatment she noted mild fatigue and increasing dysuria. She was given a prescription for Pyridium to manage. No issues or complaints verbalized during or after brachytherapy.  Plan: The patient has completed radiation treatment. The patient will return to radiation oncology clinic for routine followup in one month. I advised them to call or return sooner if they have any questions or concerns related to their recovery or treatment.  -----------------------------------  Blair Promise, PhD, MD  This document serves as a record of services personally performed by Gery Pray, MD. It was created on his behalf by Rae Lips, a trained medical scribe. The creation of this record is based on the scribe's personal observations and the provider's statements to them. This document has been checked and approved by the attending provider.

## 2018-10-23 ENCOUNTER — Other Ambulatory Visit: Payer: Self-pay | Admitting: Gynecologic Oncology

## 2018-10-23 DIAGNOSIS — C541 Malignant neoplasm of endometrium: Secondary | ICD-10-CM

## 2018-10-24 ENCOUNTER — Ambulatory Visit: Payer: Medicare Other | Admitting: Radiation Oncology

## 2018-10-25 ENCOUNTER — Telehealth: Payer: Self-pay | Admitting: Oncology

## 2018-10-25 NOTE — Telephone Encounter (Signed)
[name] called and said she would like to file an [place] but needs codes.  Called her back and left her a message.

## 2018-11-11 DIAGNOSIS — Z6836 Body mass index (BMI) 36.0-36.9, adult: Secondary | ICD-10-CM | POA: Diagnosis not present

## 2018-11-11 DIAGNOSIS — C548 Malignant neoplasm of overlapping sites of corpus uteri: Secondary | ICD-10-CM | POA: Diagnosis not present

## 2018-11-11 DIAGNOSIS — I10 Essential (primary) hypertension: Secondary | ICD-10-CM | POA: Diagnosis not present

## 2018-11-11 DIAGNOSIS — E7849 Other hyperlipidemia: Secondary | ICD-10-CM | POA: Diagnosis not present

## 2018-11-11 DIAGNOSIS — Z1389 Encounter for screening for other disorder: Secondary | ICD-10-CM | POA: Diagnosis not present

## 2018-11-11 DIAGNOSIS — E119 Type 2 diabetes mellitus without complications: Secondary | ICD-10-CM | POA: Diagnosis not present

## 2018-11-16 DIAGNOSIS — I10 Essential (primary) hypertension: Secondary | ICD-10-CM | POA: Diagnosis not present

## 2018-11-16 DIAGNOSIS — Z1389 Encounter for screening for other disorder: Secondary | ICD-10-CM | POA: Diagnosis not present

## 2018-11-16 DIAGNOSIS — E119 Type 2 diabetes mellitus without complications: Secondary | ICD-10-CM | POA: Diagnosis not present

## 2018-11-22 ENCOUNTER — Ambulatory Visit
Admission: RE | Admit: 2018-11-22 | Discharge: 2018-11-22 | Disposition: A | Discharge status: Home / Self Care | Payer: Medicare Other | Source: Ambulatory Visit | Attending: Radiation Oncology | Admitting: Radiation Oncology

## 2018-11-22 ENCOUNTER — Encounter: Payer: Self-pay | Admitting: Radiation Oncology

## 2018-11-22 ENCOUNTER — Other Ambulatory Visit: Payer: Self-pay

## 2018-11-22 ENCOUNTER — Inpatient Hospital Stay: Payer: Medicare Other | Attending: Gynecology | Admitting: Gynecology

## 2018-11-22 ENCOUNTER — Encounter: Payer: Self-pay | Admitting: Gynecology

## 2018-11-22 VITALS — BP 156/75 | HR 81 | Temp 97.5°F | Resp 18 | Ht 65.0 in | Wt 207.0 lb

## 2018-11-22 DIAGNOSIS — Z9221 Personal history of antineoplastic chemotherapy: Secondary | ICD-10-CM | POA: Diagnosis not present

## 2018-11-22 DIAGNOSIS — Z79899 Other long term (current) drug therapy: Secondary | ICD-10-CM | POA: Diagnosis not present

## 2018-11-22 DIAGNOSIS — Z79818 Long term (current) use of other agents affecting estrogen receptors and estrogen levels: Secondary | ICD-10-CM | POA: Diagnosis not present

## 2018-11-22 DIAGNOSIS — C7982 Secondary malignant neoplasm of genital organs: Secondary | ICD-10-CM | POA: Insufficient documentation

## 2018-11-22 DIAGNOSIS — Z923 Personal history of irradiation: Secondary | ICD-10-CM | POA: Insufficient documentation

## 2018-11-22 DIAGNOSIS — Z7982 Long term (current) use of aspirin: Secondary | ICD-10-CM | POA: Insufficient documentation

## 2018-11-22 DIAGNOSIS — C541 Malignant neoplasm of endometrium: Secondary | ICD-10-CM | POA: Insufficient documentation

## 2018-11-22 DIAGNOSIS — Z7984 Long term (current) use of oral hypoglycemic drugs: Secondary | ICD-10-CM | POA: Insufficient documentation

## 2018-11-22 DIAGNOSIS — Z90710 Acquired absence of both cervix and uterus: Secondary | ICD-10-CM

## 2018-11-22 DIAGNOSIS — Z90722 Acquired absence of ovaries, bilateral: Secondary | ICD-10-CM | POA: Diagnosis not present

## 2018-11-22 NOTE — Patient Instructions (Signed)
Follow up with Dr Aldean Ast in 3 months and Dr Sondra Come in 6 months.  At your next appointment with Dr Aldean Ast, you will have a pap smear. Call before then if you have any concerns.

## 2018-11-22 NOTE — Progress Notes (Signed)
Consult Note: Gyn-Onc   [name] 68 y.o. female  Chief Complaint  Patient presents with  . Endometrial cancer ([place])    Assessment and plan: Recurrent endometrial carcinoma in the vaginal apex.  She has had a complete response to radiation therapy.  She has some urinary leakage at night but not during the day.  Otherwise she has no side effects from the radiation therapy and overall is doing well.  We will plan on her returning in 3 months and we will repeat a Pap smear at that time.  (I did not repeat a Pap smear today given her proximity to completing radiation therapy)  Interval History:  The patient returns today having completed RT to the central pelvis to a dose of 45 Gy and additional HDR brachy therapy completed on October 13, 2018.  She indicates that during therapy she had some dysuria which resolved using Pyridium.  She does have some urinary leakage at night but not during the day..  She indicates that she has had  no pain.  Overall she feels well.  She has no other GI, GU, or pelvic symptoms.  She specifically denies any vaginal bleeding or discharge.   HPI  In 2003 the patient underwent abdominal hysterectomy for a stage IB grade 1 endometrial carcinoma. With good prognostic features she had no subsequent therapy. 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 These were biopsied. She was then treated with 6 cycles of carboplatin and Taxol had a partial response. Subsequently she was placed on Megace 40 mg 3 times a day. She then had a complete response with resolution of all pulmonary nodules. She's continued to take Megace for subsequent 10 years. She also received pelvic radiation therapy to treat the vaginal recurrence.  September 2019 the patient presented with a exophytic vaginal recurrence.  CT scan showed no other evidence of metastatic disease.  This isolated lesion was treated with excision and then  IM RT to the central pelvis to a dose of 45 Gy and additional HDR brachy therapy completed on October 13, 2018.  Review of Systems:10 point review of systems is negative as noted above.   Vitals: Blood pressure (!) 156/75, pulse 81, temperature (!) 97.5 F (36.4 C), temperature source Oral, resp. rate 18, height 5\' 5"  (1.651 m), weight 207 lb (93.9 kg), SpO2 100 %.  Physical Exam: General : The patient is a healthy obese woman in no acute distress.  HEENT: normocephalic, extraoccular movements normal; neck is supple without thyromegally  Lynphnodes: Supraclavicular and inguinal nodes not enlarged Breasts: Without masses skin changes or discharge.  Abdomen: Soft, non-tender, no ascites, no organomegally, no masses, no hernias  Pelvic:  EGBUS: Normal female  Vagina: The vagina is normal the vaginal apex has no lesions.Marland Kitchen Urethra and Bladder: Normal, non-tender  Cervix: Surgically absent  Uterus: Surgically absent  Bi-manual examination: Non-tender; no adenxal masses or nodularity  Lower extremities: No edema or varicosities. Normal range of motion      Allergies  Allergen Reactions  . Compazine Other (See Comments)    Pt does not want med due to strange side effects     Past Medical History:  Diagnosis Date  . Aortic atherosclerosis ([place]) 05/2018  . Aortic valve sclerosis 01/14/2015   Moderate without stenosis, noted on ECHO  . Diabetes mellitus   . Diverticulosis 12/18/2016   Mild, noted on colonoscopy  . Endometrial cancer ([place])    Metastatic,  Stage IB grade 1, recurrent  . Grade I diastolic dysfunction 16/07/9603   noted on ECHO  . Heart murmur   . History of cardiomegaly 2003  . History of colon polyps   . Hypertension   . Internal hemorrhoids 12/18/2016   noted on colonoscopy  . LAE (left atrial enlargement) 01/14/2015   Mild, noted on ECHO  . Lung cancer ([place])   . LVH (left ventricular hypertrophy) 01/24/2015   Mild, noted on ECHO  . Mass of right lung   .  Obesity   . Pneumothorax on right 2005   History of   . PONV (postoperative nausea and vomiting)   . Pulmonary nodules    Bilateral  . RBBB (right bundle branch block)   . Renal cyst, left    pt unaware  . Spondylosis    Mild, lumbar  . Wears contact lenses   . Wears glasses   . Wears partial dentures    upper and lower    Past Surgical History:  Procedure Laterality Date  . ABDOMINAL HYSTERECTOMY  04/2002   TAH  . BILATERAL OOPHORECTOMY  1998  . BREAST SURGERY     reduction  . CESAREAN SECTION     x2  . CHOLECYSTECTOMY    . COLONOSCOPY N/A 12/18/2016   Procedure: COLONOSCOPY;  Surgeon: Danie Binder, MD;  Location: [facility];  Service: Endoscopy;  Laterality: N/A;  9:45 AM  . REDUCTION MAMMAPLASTY Bilateral   . thoracic lung biopsy Left   . TONSILLECTOMY      Current Outpatient Medications  Medication Sig Dispense Refill  . aspirin EC 81 MG tablet Take 81 mg by mouth daily.    . Calcium Carb-Cholecalciferol (CALCIUM 500+D PO) Take 1 tablet by mouth daily.    . cholecalciferol (VITAMIN D) 1000 UNITS tablet Take 1,000 Units by mouth daily.    Marland Kitchen CINNAMON PO Take 1,000 mg by mouth daily.     . diclofenac (VOLTAREN) 50 MG EC tablet   0  . Garlic 5409 MG CAPS Take 1 capsule by mouth daily.     Marland Kitchen GLIPIZIDE XL PO Take 10 mg by mouth daily.     Marland Kitchen lisinopril (PRINIVIL,ZESTRIL) 20 MG tablet Take 20 mg by mouth daily.    . megestrol (MEGACE) 40 MG tablet TAKE (1) TABLET BY MOUTH (3) TIMES DAILY. 90 tablet 0  . metFORMIN (GLUCOPHAGE) 1000 MG tablet Take 1,000 mg by mouth 2 (two) times daily with a meal.     . Multiple Vitamin (MULTI VITAMIN DAILY PO) Take 1 each by mouth daily.    . NON FORMULARY Dark Cherry Extract    . Omega-3 Fatty Acids (FISH OIL) 1000 MG CAPS Take 1 each by mouth daily.     . phenazopyridine (PYRIDIUM) 200 MG tablet Take 1 tablet (200 mg total) by mouth 3 (three) times daily as needed for pain. 30 tablet 0  . simvastatin (ZOCOR) 20 MG tablet Take 20 mg  by mouth every evening.     . Turmeric 500 MG CAPS Take 1 capsule by mouth daily.     . vitamin C (ASCORBIC ACID) 500 MG tablet Take 500 mg by mouth daily.     No current facility-administered medications for this visit.     Social History   Socioeconomic History  . Marital status: Divorced    Spouse name: Not on file  . Number of children: Not on file  . Years of education: Not on file  . Highest education  level: Not on file  Occupational History  . Not on file  Social Needs  . Financial resource strain: Not on file  . Food insecurity:    Worry: Not on file    Inability: Not on file  . Transportation needs:    Medical: Not on file    Non-medical: Not on file  Tobacco Use  . Smoking status: Never Smoker  . Smokeless tobacco: Never Used  Substance and Sexual Activity  . Alcohol use: No  . Drug use: No  . Sexual activity: Not Currently    Birth control/protection: Surgical  Lifestyle  . Physical activity:    Days per week: Not on file    Minutes per session: Not on file  . Stress: Not on file  Relationships  . Social connections:    Talks on phone: Not on file    Gets together: Not on file    Attends religious service: Not on file    Active member of club or organization: Not on file    Attends meetings of clubs or organizations: Not on file    Relationship status: Not on file  . Intimate partner violence:    Fear of current or ex partner: Not on file    Emotionally abused: Not on file    Physically abused: Not on file    Forced sexual activity: Not on file  Other Topics Concern  . Not on file  Social History Narrative  . Not on file    Family History  Problem Relation Age of Onset  . Ovarian cancer Mother   . Heart Problems Father   . Coronary artery disease Brother       [name], MD 11/22/2018, 10:32 AM

## 2018-11-22 NOTE — Progress Notes (Signed)
Pt presents today for one month f/u with Dr. Sondra Come. Pt reports fatigue is improving. Pt denies c/o pain. Pt denies dysuria/hematuria. Pt denies vaginal bleeding/discharge. Pt denies diarrhea/constipation. Pt education given on vaginal dilator with good understanding. Pt reports that pt experiences nocturnal urinary urgency and occasional incontinence. Pt attributes this to sleeping soundly.   BP (!) 156/75 (BP Location: Left Arm, Patient Position: Sitting)   Pulse 81   Temp (!) 97.5 F (36.4 C) (Oral)   Resp 18   Ht 5\' 5"  (1.651 m)   Wt 207 lb (93.9 kg)   SpO2 100%   BMI 34.45 kg/m   Wt Readings from Last 3 Encounters:  11/22/18 207 lb (93.9 kg)  09/27/18 206 lb 8 oz (93.7 kg)  08/02/18 200 lb 6.4 oz (90.9 kg)   Loma Sousa, RN BSN

## 2018-11-22 NOTE — Progress Notes (Signed)
Radiation Oncology         [phone]) [phone] ________________________________  Name: [name] MRN: [id number]  Date: 11/22/2018  DOB: 1951-04-10  Follow-Up Visit Note  CC: Jake Samples, PA-C  [name]    ICD-10-CM   1. Endometrial cancer ([place]) C54.1   2. 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 female with Recurrent endometrial cancer     Interval Since Last Radiation:  6 weeks  Narrative:  The patient returns today for routine follow-up.  She reports some fatigue but this is improving. She denies any vaginal bleeding or discharge. She denies any hematuria or rectal bleeding. She has chronic nocturnal urinary urgency and occasional incontinence in light of this issue.                              ALLERGIES:  is allergic to compazine.  Meds: Current Outpatient Medications  Medication Sig Dispense Refill  . aspirin EC 81 MG tablet Take 81 mg by mouth daily.    . Calcium Carb-Cholecalciferol (CALCIUM 500+D PO) Take 1 tablet by mouth daily.    . cholecalciferol (VITAMIN D) 1000 UNITS tablet Take 1,000 Units by mouth daily.    Marland Kitchen CINNAMON PO Take 1,000 mg by mouth daily.     . diclofenac (VOLTAREN) 50 MG EC tablet   0  . Garlic 6301 MG CAPS Take 1 capsule by mouth daily.     Marland Kitchen GLIPIZIDE XL PO Take 10 mg by mouth daily.     Marland Kitchen lisinopril (PRINIVIL,ZESTRIL) 20 MG tablet Take 20 mg by mouth daily.    . megestrol (MEGACE) 40 MG tablet TAKE (1) TABLET BY MOUTH (3) TIMES DAILY. 90 tablet 0  . metFORMIN (GLUCOPHAGE) 1000 MG tablet Take 1,000 mg by mouth 2 (two) times daily with a meal.     . Multiple Vitamin (MULTI VITAMIN DAILY PO) Take 1 each by mouth daily.    . NON FORMULARY Dark Cherry Extract    . Omega-3 Fatty Acids (FISH OIL) 1000 MG CAPS Take 1 each by mouth daily.     . phenazopyridine (PYRIDIUM) 200 MG tablet Take 1 tablet (200 mg total) by mouth 3 (three) times daily as needed for pain. 30 tablet 0  .  simvastatin (ZOCOR) 20 MG tablet Take 20 mg by mouth every evening.     . Turmeric 500 MG CAPS Take 1 capsule by mouth daily.     . vitamin C (ASCORBIC ACID) 500 MG tablet Take 500 mg by mouth daily.     No current facility-administered medications for this encounter.     Physical Findings: The patient is in no acute distress. Patient is alert and oriented.  height is 5\' 5"  (1.651 m) and weight is 207 lb (93.9 kg). Her oral temperature is 97.5 F (36.4 C) (abnormal). Her blood pressure is 156/75 (abnormal) and her pulse is 81. Her respiration is 18 and oxygen saturation is 100%. .  Lungs are clear to auscultation bilaterally. Heart has regular rate and rhythm. No palpable cervical, supraclavicular, or axillary adenopathy. Abdomen soft, non-tender, normal bowel sounds. Pelvic exam deferred as she will see Dr. Fermin Schwab later this morning  Lab Findings: Lab Results  Component Value Date   HGB 12.6 07/19/2018   HCT 37.0 07/19/2018    Radiographic Findings: No results found.  Impression:  The patient is recovering from the effects of radiation.  Tolerated radiation therapy well  Plan:  Patient will return for follow-up with Dr. Fermin Schwab in 3 months, Pap smear and exam at that time. Routine follow-up in radiation oncology in 6 months  ____________________________________ Gery Pray, MD

## 2018-11-30 ENCOUNTER — Other Ambulatory Visit: Payer: Self-pay | Admitting: Gynecologic Oncology

## 2018-11-30 DIAGNOSIS — C541 Malignant neoplasm of endometrium: Secondary | ICD-10-CM

## 2019-02-01 ENCOUNTER — Telehealth: Payer: Self-pay | Admitting: *Deleted

## 2019-02-01 NOTE — Telephone Encounter (Signed)
Attempted to contact the patient regarding her app for next week, left message for the patient to call the office back. Need to move the appt from 4/28 to 6/9 at 10am

## 2019-02-07 ENCOUNTER — Ambulatory Visit: Payer: Medicare Other | Admitting: Gynecology

## 2019-02-22 ENCOUNTER — Telehealth: Payer: Self-pay | Admitting: *Deleted

## 2019-02-22 NOTE — Telephone Encounter (Signed)
Called and left the patient a message to call the office back. Need to move her appt from 6/9 with Dr. Fermin Schwab to 5/19 with Dr. Alycia Rossetti

## 2019-02-23 ENCOUNTER — Other Ambulatory Visit (HOSPITAL_COMMUNITY): Payer: Self-pay | Admitting: Gynecology

## 2019-02-23 DIAGNOSIS — Z1231 Encounter for screening mammogram for malignant neoplasm of breast: Secondary | ICD-10-CM

## 2019-02-27 ENCOUNTER — Telehealth: Payer: Self-pay | Admitting: *Deleted

## 2019-02-27 NOTE — Telephone Encounter (Signed)
Called and spoke with the patient regarding her appt for tomorrow. Patient has no signs/sympotms, has not traveled and has had no exposure to COVID. Explained the new check in process, new parking process and the mask/no visitor policy.

## 2019-02-28 ENCOUNTER — Other Ambulatory Visit: Payer: Self-pay

## 2019-02-28 ENCOUNTER — Encounter: Payer: Self-pay | Admitting: Gynecologic Oncology

## 2019-02-28 ENCOUNTER — Other Ambulatory Visit (HOSPITAL_COMMUNITY)
Admission: RE | Admit: 2019-02-28 | Discharge: 2019-02-28 | Disposition: A | Discharge status: Home / Self Care | Payer: Medicare Other | Source: Ambulatory Visit | Attending: Gynecology | Admitting: Gynecology

## 2019-02-28 ENCOUNTER — Other Ambulatory Visit: Payer: Self-pay | Admitting: Gynecologic Oncology

## 2019-02-28 ENCOUNTER — Inpatient Hospital Stay: Payer: Medicare Other | Attending: Gynecology | Admitting: Gynecologic Oncology

## 2019-02-28 VITALS — BP 165/77 | HR 80 | Temp 98.5°F | Resp 20 | Ht 65.0 in | Wt 207.0 lb

## 2019-02-28 DIAGNOSIS — C541 Malignant neoplasm of endometrium: Secondary | ICD-10-CM | POA: Insufficient documentation

## 2019-02-28 DIAGNOSIS — R87629 Unspecified abnormal cytological findings in specimens from vagina: Secondary | ICD-10-CM | POA: Insufficient documentation

## 2019-02-28 DIAGNOSIS — C7982 Secondary malignant neoplasm of genital organs: Secondary | ICD-10-CM

## 2019-02-28 DIAGNOSIS — Z79818 Long term (current) use of other agents affecting estrogen receptors and estrogen levels: Secondary | ICD-10-CM | POA: Diagnosis not present

## 2019-02-28 DIAGNOSIS — Z923 Personal history of irradiation: Secondary | ICD-10-CM | POA: Diagnosis not present

## 2019-02-28 DIAGNOSIS — C7800 Secondary malignant neoplasm of unspecified lung: Secondary | ICD-10-CM

## 2019-02-28 DIAGNOSIS — Z90710 Acquired absence of both cervix and uterus: Secondary | ICD-10-CM | POA: Insufficient documentation

## 2019-02-28 NOTE — Patient Instructions (Addendum)
Follow up with Dr. Fermin Schwab in 6 months. Follow up with Radiation Oncology in 3 months. We will notify you of the results of your pap smear from today.

## 2019-02-28 NOTE — Progress Notes (Signed)
Consult Note: Gyn-Onc   [name] 68 y.o. female  No chief complaint on file.   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  She was initially diagnosed in 2003 and had a recurrence in 2005. She was then NED until 07/2018.  Clinically, she has no evidence of recurrent disease today.  She will decrease amount of fluid that she drinks at night and she will also change her to [place] and cinnamon to the morning to see if this helps with some of the nocturia that she has.  I do not think that she requires antibiotics for her bug bites but she will keep an eye on these as she is diabetic.  She knows to contact her primary care physician if they begin to look infected in any way.  We will follow up on the results from today and she will return to see Dr. Fermin Schwab in 3 months.  HPI  In 2003 the patient underwent abdominal hysterectomy for a stage IB grade 1 endometrial carcinoma. With good prognostic features she had no subsequent therapy. 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 These were biopsied. She was then treated with 6 cycles of carboplatin and Taxol had a partial response. Subsequently she was placed on Megace 40 mg 3 times a day. She then had a complete response with resolution of all pulmonary nodules. She's continued to take Megace for subsequent 10 years. She also received pelvic radiation therapy to treat the vaginal recurrence.  September 2019 the patient presented with a exophytic vaginal recurrence.  CT scan showed no other evidence of metastatic disease.  This isolated lesion was treated with excision and then IM RT to the central pelvis to a dose of 45 Gy and additional HDR brachy therapy completed on October 13, 2018. She comes in today for follow up.  Interval History:  The patient returns today having completed RT to the central pelvis to a dose of 45 Gy and  additional HDR brachy therapy completed on October 13, 2018.  She is doing very well and really has no significant complaints.  The biggest issue she complains of is nocturia.  Her last hemoglobin A1c was in the mid 6 range.  We reviewed her medications and the only things that she takes at night that could be contributing would be cinnamon and tumor rec.  She will change these to the morning to see if this helps with her nocturia.  She also drinks water before she goes to bed at night we discussed that this might make her void more in the evening.  She thought it was all related to her radiation but it would be unusual that she would have some incontinence in the day that she does not experience at night.  She otherwise denies any bleeding.  She has no new medical diagnoses.  She states that she walks 30 minutes 2-3 times a week.  Review of Systems: Constitutional: She denies fevers, unintentional weight loss or weight gain. Skin: + Bug bite on left leg Cardiovascular: No chest pain, shortness of breath, or edema  Pulmonary: No cough or wheeze.  Gastro Intestinal: No nausea, vomiting, constipation, or diarrhea reported.  Genitourinary: + Incontinence and nocturia.  Denies vaginal bleeding and discharge.  Musculoskeletal: No joint  pain.  Neurologic: No weakness, numbness, or change in gait.  Psychology: No changes   Allergies  Allergen Reactions  . Compazine Other (See Comments)  Pt does not want med due to strange side effects     Past Medical History:  Diagnosis Date  . Aortic atherosclerosis ([place]) 05/2018  . Aortic valve sclerosis 01/14/2015   Moderate without stenosis, noted on ECHO  . Diabetes mellitus   . Diverticulosis 12/18/2016   Mild, noted on colonoscopy  . Endometrial cancer ([place])    Metastatic, Stage IB grade 1, recurrent  . Grade I diastolic dysfunction 60/45/4098   noted on ECHO  . Heart murmur   . History of cardiomegaly 2003  . History of colon polyps   .  Hypertension   . Internal hemorrhoids 12/18/2016   noted on colonoscopy  . LAE (left atrial enlargement) 01/14/2015   Mild, noted on ECHO  . Lung cancer ([place])   . LVH (left ventricular hypertrophy) 01/24/2015   Mild, noted on ECHO  . Mass of right lung   . Obesity   . Pneumothorax on right 2005   History of   . PONV (postoperative nausea and vomiting)   . Pulmonary nodules    Bilateral  . RBBB (right bundle branch block)   . Renal cyst, left    pt unaware  . Spondylosis    Mild, lumbar  . Wears contact lenses   . Wears glasses   . Wears partial dentures    upper and lower    Past Surgical History:  Procedure Laterality Date  . ABDOMINAL HYSTERECTOMY  04/2002   TAH  . BILATERAL OOPHORECTOMY  1998  . BREAST SURGERY     reduction  . CESAREAN SECTION     x2  . CHOLECYSTECTOMY    . COLONOSCOPY N/A 12/18/2016   Procedure: COLONOSCOPY;  Surgeon: Danie Binder, MD;  [facility];  Service: Endoscopy;  Laterality: N/A;  9:45 AM  . REDUCTION MAMMAPLASTY Bilateral   . thoracic lung biopsy Left   . TONSILLECTOMY      Current Outpatient Medications  Medication Sig Dispense Refill  . aspirin EC 81 MG tablet Take 81 mg by mouth daily.    . Calcium Carb-Cholecalciferol (CALCIUM 500+D PO) Take 1 tablet by mouth daily.    . cholecalciferol (VITAMIN D) 1000 UNITS tablet Take 1,000 Units by mouth daily.    Marland Kitchen CINNAMON PO Take 1,000 mg by mouth daily.     . diclofenac (VOLTAREN) 50 MG EC tablet   0  . Garlic 1191 MG CAPS Take 1 capsule by mouth daily.     Marland Kitchen GLIPIZIDE XL PO Take 10 mg by mouth daily.     Marland Kitchen lisinopril (PRINIVIL,ZESTRIL) 20 MG tablet Take 20 mg by mouth daily.    . megestrol (MEGACE) 40 MG tablet TAKE (1) TABLET BY MOUTH (3) TIMES DAILY. 90 tablet 3  . metFORMIN (GLUCOPHAGE) 1000 MG tablet Take 1,000 mg by mouth 2 (two) times daily with a meal.     . Multiple Vitamin (MULTI VITAMIN DAILY PO) Take 1 each by mouth daily.    . NON FORMULARY Dark Cherry Extract     . Omega-3 Fatty Acids (FISH OIL) 1000 MG CAPS Take 1 each by mouth daily.     . phenazopyridine (PYRIDIUM) 200 MG tablet Take 1 tablet (200 mg total) by mouth 3 (three) times daily as needed for pain. 30 tablet 0  . simvastatin (ZOCOR) 20 MG tablet Take 20 mg by mouth every evening.     . Turmeric 500 MG CAPS Take 1 capsule by mouth daily.     . vitamin C (ASCORBIC  ACID) 500 MG tablet Take 500 mg by mouth daily.     No current facility-administered medications for this visit.     Social History   Socioeconomic History  . Marital status: Divorced    Spouse name: Not on file  . Number of children: Not on file  . Years of education: Not on file  . Highest education level: Not on file  Occupational History  . Not on file  Social Needs  . Financial resource strain: Not on file  . Food insecurity:    Worry: Not on file    Inability: Not on file  . Transportation needs:    Medical: Not on file    Non-medical: Not on file  Tobacco Use  . Smoking status: Never Smoker  . Smokeless tobacco: Never Used  Substance and Sexual Activity  . Alcohol use: No  . Drug use: No  . Sexual activity: Not Currently    Birth control/protection: Surgical  Lifestyle  . Physical activity:    Days per week: Not on file    Minutes per session: Not on file  . Stress: Not on file  Relationships  . Social connections:    Talks on phone: Not on file    Gets together: Not on file    Attends religious service: Not on file    Active member of club or organization: Not on file    Attends meetings of clubs or organizations: Not on file    Relationship status: Not on file  . Intimate partner violence:    Fear of current or ex partner: Not on file    Emotionally abused: Not on file    Physically abused: Not on file    Forced sexual activity: Not on file  Other Topics Concern  . Not on file  Social History Narrative  . Not on file    Family History  Problem Relation Age of Onset  . Ovarian cancer  Mother   . Heart Problems Father   . Coronary artery disease Brother    Vitals: Please refer to Epic  Physical Exam: General : The patient is a healthy obese woman in no acute distress.   Neck: Supple, no lymphadenopathy, no thyromegaly.  Abdomen: Obese, soft, nontender, nondistended.  There are no palpable masses or hepatosplenomegaly.  Exam is somewhat limited by habitus.  She is well-healed transverse skin incision.  There is no Candida under her pannus.  Groins: No lymphadenopathy.  Extremities: No edema.  She does have 2 bug bites on her left lower extremity.  There is some surrounding erythema but no purulence.  Pelvic: External genitalia within normal limits.  Vagina is markedly atrophic.  The vaginal cuff is visualized.  There is no visible lesions.  There is no discharge.  Pap smear was submitted without difficulty.  Bimanual examination reveals no masses or nodularity.  Rectal confirms.       Kathleen Winner A., MD 02/28/2019, 8:50 AM

## 2019-02-28 NOTE — Addendum Note (Signed)
Addended by: Joylene John D on: 02/28/2019 11:46 AM   Modules accepted: Orders

## 2019-03-02 ENCOUNTER — Other Ambulatory Visit: Payer: Self-pay

## 2019-03-02 ENCOUNTER — Ambulatory Visit (HOSPITAL_COMMUNITY)
Admission: RE | Admit: 2019-03-02 | Discharge: 2019-03-02 | Disposition: A | Discharge status: Home / Self Care | Payer: Medicare Other | Source: Ambulatory Visit | Attending: Gynecology | Admitting: Gynecology

## 2019-03-02 DIAGNOSIS — Z1231 Encounter for screening mammogram for malignant neoplasm of breast: Secondary | ICD-10-CM | POA: Diagnosis not present

## 2019-03-02 LAB — CYTOLOGY - PAP

## 2019-03-03 ENCOUNTER — Telehealth: Payer: Self-pay | Admitting: Gynecologic Oncology

## 2019-03-03 ENCOUNTER — Telehealth: Payer: Self-pay | Admitting: *Deleted

## 2019-03-03 NOTE — Telephone Encounter (Signed)
Mistake.

## 2019-03-03 NOTE — Telephone Encounter (Signed)
Left message asking the patient to please call the office. Attempted to call the patient to discuss pap results with Dr. Elenora Gamma recommendations for an office visit with potential biopsy.

## 2019-03-13 NOTE — Progress Notes (Signed)
Consult Note: Gyn-Onc   [name] 68 y.o. female  Chief Complaint  Patient presents with  . 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  She was initially diagnosed in 2003 and had a recurrence in 2005. She was then NED until 07/2018.  Clinically, she has no evidence of recurrent disease today.  She will decrease amount of fluid that she drinks at night and she will also change her tumeric and cinnamon to the morning to see if this helps with some of the nocturia that she has.  Her colposcopy was negative today.  There are post radiation changes but there is no evidence of recurrence.  She will return to see [place] in August and then we will get her back in with Dr. Sondra Come.  We will proceed with colposcopy at the time of her next visit.   HPI  In 2003 the patient underwent abdominal hysterectomy for a stage IB grade 1 endometrial carcinoma. With good prognostic features she had no subsequent therapy. 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 These were biopsied. She was then treated with 6 cycles of carboplatin and Taxol had a partial response. Subsequently she was placed on Megace 40 mg 3 times a day. She then had a complete response with resolution of all pulmonary nodules. She's continued to take Megace for subsequent 10 years. She also received pelvic radiation therapy to treat the vaginal recurrence.  September 2019 the patient presented with a exophytic vaginal recurrence.  CT scan showed no other evidence of metastatic disease.  This isolated lesion was treated with excision and then IM RT to the central pelvis to a dose of 45 Gy and additional HDR brachy therapy completed on October 13, 2018. She comes in today for follow up.  Interval History:  The patient returns today having completed RT to the central pelvis to a dose of 45 Gy and additional HDR  brachy therapy completed on October 13, 2018.    I saw her last December 29, 2018.  Her exam was unremarkable but her Pap smear revealed atypical glandular cells.  She comes in today for follow-up.  She is had no changes in her history since the last time I saw her.   Allergies  Allergen Reactions  . Compazine Other (See Comments)    Pt does not want med due to strange side effects     Past Medical History:  Diagnosis Date  . Aortic atherosclerosis ([place]) 05/2018  . Aortic valve sclerosis 01/14/2015   Moderate without stenosis, noted on ECHO  . Diabetes mellitus   . Diverticulosis 12/18/2016   Mild, noted on colonoscopy  . Endometrial cancer ([place])    Metastatic, Stage IB grade 1, recurrent  . Grade I diastolic dysfunction 43/32/9518   noted on ECHO  . Heart murmur   . History of cardiomegaly 2003  . History of colon polyps   . Hypertension   . Internal hemorrhoids 12/18/2016   noted on colonoscopy  . LAE (left atrial enlargement) 01/14/2015   Mild, noted on ECHO  . Lung cancer ([place])   . LVH (left ventricular hypertrophy) 01/24/2015   Mild, noted on ECHO  . Mass of right lung   . Obesity   . Pneumothorax on right 2005   History of   . PONV (postoperative nausea and vomiting)   . Pulmonary nodules    Bilateral  .  RBBB (right bundle branch block)   . Renal cyst, left    pt unaware  . Spondylosis    Mild, lumbar  . Wears contact lenses   . Wears glasses   . Wears partial dentures    upper and lower    Past Surgical History:  Procedure Laterality Date  . ABDOMINAL HYSTERECTOMY  04/2002   TAH  . BILATERAL OOPHORECTOMY  1998  . BREAST SURGERY     reduction  . CESAREAN SECTION     x2  . CHOLECYSTECTOMY    . COLONOSCOPY N/A 12/18/2016   Procedure: COLONOSCOPY;  Surgeon: Danie Binder, MD;  [facility];  Service: Endoscopy;  Laterality: N/A;  9:45 AM  . REDUCTION MAMMAPLASTY Bilateral   . thoracic lung biopsy Left   . TONSILLECTOMY      Current  Outpatient Medications  Medication Sig Dispense Refill  . aspirin EC 81 MG tablet Take 81 mg by mouth daily.    . Calcium Carb-Cholecalciferol (CALCIUM 500+D PO) Take 1 tablet by mouth daily.    . cholecalciferol (VITAMIN D) 1000 UNITS tablet Take 1,000 Units by mouth daily.    Marland Kitchen CINNAMON PO Take 1,000 mg by mouth daily.     . diclofenac (VOLTAREN) 50 MG EC tablet   0  . Garlic 9371 MG CAPS Take 1 capsule by mouth daily.     Marland Kitchen GLIPIZIDE XL PO Take 10 mg by mouth daily.     Marland Kitchen lisinopril (PRINIVIL,ZESTRIL) 20 MG tablet Take 20 mg by mouth daily.    . megestrol (MEGACE) 40 MG tablet TAKE (1) TABLET BY MOUTH (3) TIMES DAILY. 90 tablet 3  . metFORMIN (GLUCOPHAGE) 1000 MG tablet Take 1,000 mg by mouth 2 (two) times daily with a meal.     . Multiple Vitamin (MULTI VITAMIN DAILY PO) Take 1 each by mouth daily.    . NON FORMULARY Dark Cherry Extract    . Omega-3 Fatty Acids (FISH OIL) 1000 MG CAPS Take 1 each by mouth daily.     . simvastatin (ZOCOR) 20 MG tablet Take 20 mg by mouth every evening.     . Turmeric 500 MG CAPS Take 1 capsule by mouth daily.     . vitamin C (ASCORBIC ACID) 500 MG tablet Take 500 mg by mouth daily.     No current facility-administered medications for this visit.     Social History   Socioeconomic History  . Marital status: Divorced    Spouse name: Not on file  . Number of children: Not on file  . Years of education: Not on file  . Highest education level: Not on file  Occupational History  . Not on file  Social Needs  . Financial resource strain: Not on file  . Food insecurity:    Worry: Not on file    Inability: Not on file  . Transportation needs:    Medical: Not on file    Non-medical: Not on file  Tobacco Use  . Smoking status: Never Smoker  . Smokeless tobacco: Never Used  Substance and Sexual Activity  . Alcohol use: No  . Drug use: No  . Sexual activity: Not Currently    Birth control/protection: Surgical  Lifestyle  . Physical activity:     Days per week: Not on file    Minutes per session: Not on file  . Stress: Not on file  Relationships  . Social connections:    Talks on phone: Not on file  Gets together: Not on file    Attends religious service: Not on file    Active member of club or organization: Not on file    Attends meetings of clubs or organizations: Not on file    Relationship status: Not on file  . Intimate partner violence:    Fear of current or ex partner: Not on file    Emotionally abused: Not on file    Physically abused: Not on file    Forced sexual activity: Not on file  Other Topics Concern  . Not on file  Social History Narrative  . Not on file    Family History  Problem Relation Age of Onset  . Ovarian cancer Mother   . Heart Problems Father   . Coronary artery disease Brother    Vitals: Please refer to Epic  Physical Exam: General : The patient is a healthy obese woman in no acute distress.   Pelvic: External genitalia within normal limits.  Vagina is markedly atrophic.  The vaginal cuff is visualized.  There is no visible lesions.  There is no discharge.   Colposcopic evaluation was performed.  There are markedly atrophic changes throughout the vagina and she tolerates the exam poorly.  With colposcopy there are no visible lesions.    Armstead Heiland A., MD 03/14/2019, 2:45 PM

## 2019-03-14 ENCOUNTER — Encounter: Payer: Self-pay | Admitting: Gynecologic Oncology

## 2019-03-14 ENCOUNTER — Other Ambulatory Visit: Payer: Self-pay

## 2019-03-14 ENCOUNTER — Inpatient Hospital Stay: Payer: Medicare Other | Attending: Gynecology | Admitting: Gynecologic Oncology

## 2019-03-14 VITALS — BP 164/62 | HR 80 | Temp 98.9°F | Resp 20 | Ht 67.0 in | Wt 208.0 lb

## 2019-03-14 DIAGNOSIS — Z90722 Acquired absence of ovaries, bilateral: Secondary | ICD-10-CM | POA: Diagnosis not present

## 2019-03-14 DIAGNOSIS — C541 Malignant neoplasm of endometrium: Secondary | ICD-10-CM | POA: Diagnosis not present

## 2019-03-14 DIAGNOSIS — Z90710 Acquired absence of both cervix and uterus: Secondary | ICD-10-CM | POA: Insufficient documentation

## 2019-03-14 DIAGNOSIS — C7982 Secondary malignant neoplasm of genital organs: Secondary | ICD-10-CM | POA: Diagnosis not present

## 2019-03-14 DIAGNOSIS — Z923 Personal history of irradiation: Secondary | ICD-10-CM | POA: Insufficient documentation

## 2019-03-14 NOTE — Patient Instructions (Signed)
Follow up in August with Dr. Fermin Schwab and December with Dr. Sondra Come.  Dr. Sondra Come 's office will call you with the appointment.

## 2019-03-21 ENCOUNTER — Ambulatory Visit: Payer: Medicare Other | Admitting: Gynecology

## 2019-03-24 ENCOUNTER — Other Ambulatory Visit: Payer: Self-pay

## 2019-03-24 ENCOUNTER — Emergency Department (HOSPITAL_COMMUNITY)
Admission: EM | Admit: 2019-03-24 | Discharge: 2019-03-24 | Disposition: A | Discharge status: Home / Self Care | Payer: Medicare Other | Attending: Emergency Medicine | Admitting: Emergency Medicine

## 2019-03-24 ENCOUNTER — Encounter (HOSPITAL_COMMUNITY): Payer: Self-pay

## 2019-03-24 ENCOUNTER — Emergency Department (HOSPITAL_COMMUNITY): Payer: Medicare Other

## 2019-03-24 DIAGNOSIS — R4182 Altered mental status, unspecified: Secondary | ICD-10-CM | POA: Diagnosis not present

## 2019-03-24 DIAGNOSIS — I110 Hypertensive heart disease with heart failure: Secondary | ICD-10-CM | POA: Diagnosis not present

## 2019-03-24 DIAGNOSIS — Z85118 Personal history of other malignant neoplasm of bronchus and lung: Secondary | ICD-10-CM | POA: Insufficient documentation

## 2019-03-24 DIAGNOSIS — Z7982 Long term (current) use of aspirin: Secondary | ICD-10-CM | POA: Diagnosis not present

## 2019-03-24 DIAGNOSIS — E1165 Type 2 diabetes mellitus with hyperglycemia: Secondary | ICD-10-CM | POA: Diagnosis not present

## 2019-03-24 DIAGNOSIS — I509 Heart failure, unspecified: Secondary | ICD-10-CM | POA: Diagnosis not present

## 2019-03-24 DIAGNOSIS — R404 Transient alteration of awareness: Secondary | ICD-10-CM | POA: Diagnosis not present

## 2019-03-24 DIAGNOSIS — Z7984 Long term (current) use of oral hypoglycemic drugs: Secondary | ICD-10-CM | POA: Diagnosis not present

## 2019-03-24 DIAGNOSIS — R000 Tachycardia, unspecified: Secondary | ICD-10-CM | POA: Diagnosis not present

## 2019-03-24 DIAGNOSIS — R739 Hyperglycemia, unspecified: Secondary | ICD-10-CM

## 2019-03-24 DIAGNOSIS — Z79899 Other long term (current) drug therapy: Secondary | ICD-10-CM | POA: Insufficient documentation

## 2019-03-24 DIAGNOSIS — R410 Disorientation, unspecified: Secondary | ICD-10-CM | POA: Diagnosis not present

## 2019-03-24 DIAGNOSIS — Z8542 Personal history of malignant neoplasm of other parts of uterus: Secondary | ICD-10-CM | POA: Diagnosis not present

## 2019-03-24 DIAGNOSIS — I10 Essential (primary) hypertension: Secondary | ICD-10-CM | POA: Diagnosis not present

## 2019-03-24 LAB — CBC WITH DIFFERENTIAL/PLATELET
Abs Immature Granulocytes: 0.07 10*3/uL (ref 0.00–0.07)
Basophils Absolute: 0 10*3/uL (ref 0.0–0.1)
Basophils Relative: 0 %
Eosinophils Absolute: 0.1 10*3/uL (ref 0.0–0.5)
Eosinophils Relative: 1 %
HCT: 37.4 % (ref 36.0–46.0)
Hemoglobin: 12.9 g/dL (ref 12.0–15.0)
Immature Granulocytes: 1 %
Lymphocytes Relative: 16 %
Lymphs Abs: 1.4 10*3/uL (ref 0.7–4.0)
MCH: 32.2 pg (ref 26.0–34.0)
MCHC: 34.5 g/dL (ref 30.0–36.0)
MCV: 93.3 fL (ref 80.0–100.0)
Monocytes Absolute: 0.5 10*3/uL (ref 0.1–1.0)
Monocytes Relative: 6 %
Neutro Abs: 6.7 10*3/uL (ref 1.7–7.7)
Neutrophils Relative %: 76 %
Platelets: 199 10*3/uL (ref 150–400)
RBC: 4.01 MIL/uL (ref 3.87–5.11)
RDW: 12.9 % (ref 11.5–15.5)
WBC: 8.8 10*3/uL (ref 4.0–10.5)
nRBC: 0 % (ref 0.0–0.2)

## 2019-03-24 LAB — COMPREHENSIVE METABOLIC PANEL
ALT: 14 U/L (ref 0–44)
AST: 13 U/L — ABNORMAL LOW (ref 15–41)
Albumin: 3.7 g/dL (ref 3.5–5.0)
Alkaline Phosphatase: 71 U/L (ref 38–126)
Anion gap: 13 (ref 5–15)
BUN: 16 mg/dL (ref 8–23)
CO2: 21 mmol/L — ABNORMAL LOW (ref 22–32)
Calcium: 9.5 mg/dL (ref 8.9–10.3)
Chloride: 105 mmol/L (ref 98–111)
Creatinine, Ser: 0.94 mg/dL (ref 0.44–1.00)
GFR calc Af Amer: >60 mL/min (ref >60)
GFR calc non Af Amer: >60 mL/min (ref >60)
Glucose, Bld: 286 mg/dL — ABNORMAL HIGH (ref 70–99)
Potassium: 3.5 mmol/L (ref 3.5–5.1)
Sodium: 139 mmol/L (ref 135–145)
Total Bilirubin: 1.1 mg/dL (ref 0.3–1.2)
Total Protein: 6.8 g/dL (ref 6.5–8.1)

## 2019-03-24 LAB — RAPID URINE DRUG SCREEN, HOSP PERFORMED
Amphetamines: NOT DETECTED
Barbiturates: NOT DETECTED
Benzodiazepines: NOT DETECTED
Cocaine: NOT DETECTED
Opiates: NOT DETECTED
Tetrahydrocannabinol: NOT DETECTED

## 2019-03-24 LAB — URINALYSIS, ROUTINE W REFLEX MICROSCOPIC
Bacteria, UA: NONE SEEN
Bilirubin Urine: NEGATIVE
Glucose, UA: >=500 mg/dL — AB
Hgb urine dipstick: NEGATIVE
Ketones, ur: NEGATIVE mg/dL
Leukocytes,Ua: NEGATIVE
Nitrite: NEGATIVE
Protein, ur: 100 mg/dL — AB
Specific Gravity, Urine: 1.009 (ref 1.005–1.030)
pH: 5 (ref 5.0–8.0)

## 2019-03-24 LAB — CBG MONITORING, ED: Glucose-Capillary: 316 mg/dL — ABNORMAL HIGH (ref 70–99)

## 2019-03-24 MED ORDER — GLIPIZIDE ER 10 MG PO TB24: 10.0000 mg | ORAL_TABLET | Freq: Every day | ORAL | Status: DC

## 2019-03-24 MED ORDER — LISINOPRIL 10 MG PO TABS
20.0000 mg | ORAL_TABLET | Freq: Once | ORAL | Status: AC
  Administered 2019-03-24: 20 mg via ORAL
  Filled 2019-03-24: qty 2

## 2019-03-24 MED ORDER — METFORMIN HCL 500 MG PO TABS
1000.0000 mg | ORAL_TABLET | Freq: Once | ORAL | Status: AC
  Administered 2019-03-24: 1000 mg via ORAL
  Filled 2019-03-24: qty 2

## 2019-03-24 NOTE — ED Notes (Signed)
Dr C in to reassess and discuss findings

## 2019-03-24 NOTE — Discharge Instructions (Addendum)
Your tests today are ok, except your blood glucose level is elevated at 286, but this level should not be the source of todays episode of confusion.  It is ok to go home at this time, but keep a close watch on your diet and make sure you are taking your diabetes medications.  Call [facility] for a recheck early next week, however return here sooner if you develop any new or return of your symptoms.

## 2019-03-24 NOTE — ED Notes (Signed)
Awaiting reassess and dispo

## 2019-03-24 NOTE — ED Notes (Signed)
JI at bedside

## 2019-03-24 NOTE — ED Notes (Signed)
Patient transported to CT 

## 2019-03-24 NOTE — ED Notes (Signed)
Pt out of bed ambulated to bath room without assistance  She is conversant oriented and moves ad lib

## 2019-03-24 NOTE — ED Notes (Signed)
LKW was 0600

## 2019-03-24 NOTE — ED Triage Notes (Addendum)
Pt brought here by staff at [employer]. Pt works at [employer] and noted to have some confusion . BP 212/91 and CGG 382. Pt denies any pain  And states I doesn't know why she is here.  States she woke up at 0600 and per family she was normal.

## 2019-03-24 NOTE — ED Provider Notes (Signed)
[facility] EMERGENCY DEPARTMENT Provider Note   CSN: [id number] Arrival date & time: 03/24/19  0945    History   Chief Complaint Chief Complaint  Patient presents with   Altered Mental Status    HPI JESSECA MARSCH is a 68 y.o. female with a history significant for diabetes, CHF, aortic valve sclerosis, hypertension, history of lung cancer and endometrial cancer presenting with a transient episode of confusion occurring at work prior to arrival.  Her boyfriend drove her to work this morning and confirmed with RN that she was having no confusion or changes in behavior when he dropped her off.  However she went in the building and could not remember how to clock in to start her shift.  Her coworkers noticed this confusion and transported her here.  Patient does endorse could not remember how to clock in but denies any other episodes of confusion since this event.  Her blood glucose is elevated today as is her blood pressure.  She states she has taken none of her morning medications yet.  She denies any symptoms including focal weakness, chest pain, palpitations, shortness of breath, headache, fevers or chills, also no abdominal pain, dysuria or diarrhea.     The history is provided by the patient.    Past Medical History:  Diagnosis Date   Aortic atherosclerosis ([place]) 05/2018   Aortic valve sclerosis 01/14/2015   Moderate without stenosis, noted on ECHO   Diabetes mellitus    Diverticulosis 12/18/2016   Mild, noted on colonoscopy   Endometrial cancer ([place])    Metastatic, Stage IB grade 1, recurrent   Grade I diastolic dysfunction 70/26/3785   noted on ECHO   Heart murmur    History of cardiomegaly 2003   History of colon polyps    Hypertension    Internal hemorrhoids 12/18/2016   noted on colonoscopy   LAE (left atrial enlargement) 01/14/2015   Mild, noted on ECHO   Lung cancer ([place])    LVH (left ventricular hypertrophy) 01/24/2015   Mild, noted on ECHO    Mass of right lung    Obesity    Pneumothorax on right 2005   History of    PONV (postoperative nausea and vomiting)    Pulmonary nodules    Bilateral   RBBB (right bundle branch block)    Renal cyst, left    pt unaware   Spondylosis    Mild, lumbar   Wears contact lenses    Wears glasses    Wears partial dentures    upper and lower    Patient Active Problem List   Diagnosis Date Noted   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[place]) 11/22/2018   Special screening for malignant neoplasms, colon    Obesity 12/21/2014   Endometrial cancer ([place]) 10/20/2013    Past Surgical History:  Procedure Laterality Date   ABDOMINAL HYSTERECTOMY  04/2002   TAH   BILATERAL OOPHORECTOMY  1998   BREAST SURGERY     reduction   CESAREAN SECTION     x2   CHOLECYSTECTOMY     COLONOSCOPY N/A 12/18/2016   Procedure: COLONOSCOPY;  Surgeon: Danie Binder, MD;  Location: [facility] ENDO SU[facility];  Service: Endoscopy;  Laterality: N/A;  9:45 AM   REDUCTION MAMMAPLASTY Bilateral    thoracic lung biopsy Left    TONSILLECTOMY       OB History   No obstetric history on file.      Home Medications    Prior to Admission  medications   Medication Sig Start Date End Date Taking? Authorizing Provider  aspirin EC 81 MG tablet Take 81 mg by mouth daily.    [provider]  Calcium Carb-Cholecalciferol (CALCIUM 500+D PO) Take 1 tablet by mouth daily.    [provider]  cholecalciferol (VITAMIN D) 1000 UNITS tablet Take 1,000 Units by mouth daily.    [provider]  CINNAMON PO Take 1,000 mg by mouth daily.     [provider]  diclofenac (VOLTAREN) 50 MG EC tablet  08/29/18   [provider]  Garlic 2951 MG CAPS Take 1 capsule by mouth daily.     [provider]  glipiZIDE (GLIPIZIDE XL) 10 MG 24 hr tablet Take 10 mg by mouth daily.     [provider]  lisinopril (PRINIVIL,ZESTRIL) 20 MG tablet Take 20 mg by mouth  daily.    [provider]  megestrol (MEGACE) 40 MG tablet TAKE (1) TABLET BY MOUTH (3) TIMES DAILY. 11/30/18   Joylene John D, NP  metFORMIN (GLUCOPHAGE) 1000 MG tablet Take 1,000 mg by mouth 2 (two) times daily with a meal.  05/19/18   [provider]  Multiple Vitamin (MULTI VITAMIN DAILY PO) Take 1 each by mouth daily.    [provider]  NON FORMULARY Dark Cherry Extract    [provider]  Omega-3 Fatty Acids (FISH OIL) 1000 MG CAPS Take 1 each by mouth daily.     [provider]  simvastatin (ZOCOR) 20 MG tablet Take 20 mg by mouth every evening.     [provider]  Turmeric 500 MG CAPS Take 1 capsule by mouth daily.     [provider]  vitamin C (ASCORBIC ACID) 500 MG tablet Take 500 mg by mouth daily.    [provider]    Family History Family History  Problem Relation Age of Onset   Ovarian cancer Mother    Heart Problems Father    Coronary artery disease Brother     Social History Social History   Tobacco Use   Smoking status: Never Smoker   Smokeless tobacco: Never Used  Substance Use Topics   Alcohol use: No   Drug use: No     Allergies   Compazine   Review of Systems Review of Systems  Constitutional: Negative for chills and fever.  HENT: Negative.   Eyes: Negative.   Respiratory: Negative for chest tightness and shortness of breath.   Cardiovascular: Negative for chest pain.  Gastrointestinal: Negative for abdominal pain and nausea.  Genitourinary: Negative.   Musculoskeletal: Negative for arthralgias, joint swelling and neck pain.  Skin: Negative.  Negative for rash and wound.  Neurological: Negative for dizziness, syncope, speech difficulty, weakness, light-headedness, numbness and headaches.       Transient confusion  Psychiatric/Behavioral: Negative.      Physical Exam Updated Vital Signs BP (!) 168/86    Pulse 100    Resp (!) 26    Ht 5\' 5"  (1.651 m)    Wt 93.4 kg     SpO2 99%    BMI 34.28 kg/m   Physical Exam Vitals signs and nursing note reviewed.  Constitutional:      Appearance: She is well-developed.  HENT:     Head: Normocephalic and atraumatic.  Eyes:     Extraocular Movements: Extraocular movements intact.     Conjunctiva/sclera: Conjunctivae normal.     Pupils: Pupils are equal, round, and reactive to light.  Neck:  Musculoskeletal: Normal range of motion.  Cardiovascular:     Rate and Rhythm: Normal rate and regular rhythm.     Heart sounds: Normal heart sounds.  Pulmonary:     Effort: Pulmonary effort is normal.     Breath sounds: Normal breath sounds. No wheezing.  Abdominal:     General: Bowel sounds are normal.     Palpations: Abdomen is soft.     Tenderness: There is no abdominal tenderness.  Musculoskeletal: Normal range of motion.  Skin:    General: Skin is warm and dry.  Neurological:     General: No focal deficit present.     Mental Status: She is alert and oriented to person, place, and time.     Cranial Nerves: No cranial nerve deficit.     Sensory: No sensory deficit.     Motor: No weakness.     Coordination: Coordination normal.     Comments: Equal grip strength, no pronator drift.  Normal finger/nose.   Psychiatric:        Mood and Affect: Mood normal.      ED Treatments / Results  Labs (all labs ordered are listed, but only abnormal results are displayed) Labs Reviewed  COMPREHENSIVE METABOLIC PANEL - Abnormal; Notable for the following components:      Result Value   CO2 21 (*)    Glucose, Bld 286 (*)    AST 13 (*)    All other components within normal limits  URINALYSIS, ROUTINE W REFLEX MICROSCOPIC - Abnormal; Notable for the following components:   Color, Urine STRAW (*)    Glucose, UA >=500 (*)    Protein, ur 100 (*)    All other components within normal limits  CBG MONITORING, ED - Abnormal; Notable for the following components:   Glucose-Capillary 316 (*)    All other components  within normal limits  CBC WITH DIFFERENTIAL/PLATELET  RAPID URINE DRUG SCREEN, HOSP PERFORMED    EKG None  Radiology Ct Head Wo Contrast  Result Date: 03/24/2019 CLINICAL DATA:  Altered mental status today. EXAM: CT HEAD WITHOUT CONTRAST TECHNIQUE: Contiguous axial images were obtained from the base of the skull through the vertex without intravenous contrast. COMPARISON:  Head CT scan 07/15/2009. FINDINGS: Brain: No evidence of acute infarction, hemorrhage, hydrocephalus, extra-axial collection or mass lesion/mass effect. Vascular: No hyperdense vessel or unexpected calcification. Skull: Intact.  No focal lesion. Sinuses/Orbits: Negative. Other: None. IMPRESSION: Negative head CT. Electronically Signed   By: Inge Rise M.D.   On: 03/24/2019 11:08    Procedures Procedures (including critical care time)  Medications Ordered in ED Medications  glipiZIDE (GLUCOTROL XL) 24 hr tablet 10 mg (has no administration in time range)  lisinopril (ZESTRIL) tablet 20 mg (20 mg Oral Given 03/24/19 1114)  metFORMIN (GLUCOPHAGE) tablet 1,000 mg (1,000 mg Oral Given 03/24/19 1114)     Initial Impression / Assessment and Plan / ED Course  I have reviewed the triage vital signs and the nursing notes.  Pertinent labs & imaging results that were available during my care of the patient were reviewed by me and considered in my medical decision making (see chart for details).        Pt with transient confusion, resolved with no further sx while in the ed.  Unclear etiology, hyperglycemia, but no acidosis.  Pt denies any sx at this time and has been alert and oriented during her ed visit.  Plan close watch on her cbg's, plan f/u with pcp next week,  return precautions outlined.  Final Clinical Impressions(s) / ED Diagnoses   Final diagnoses:  Transient confusion  Hyperglycemia    ED Discharge Orders    None       Landis Martins 03/24/19 1238    Nat Christen, MD 04/03/19 [phone]

## 2019-03-24 NOTE — ED Notes (Signed)
ED Provider at bedside. 

## 2019-03-31 ENCOUNTER — Other Ambulatory Visit (HOSPITAL_COMMUNITY): Payer: Self-pay | Admitting: Family Medicine

## 2019-03-31 DIAGNOSIS — Z6836 Body mass index (BMI) 36.0-36.9, adult: Secondary | ICD-10-CM | POA: Diagnosis not present

## 2019-03-31 DIAGNOSIS — E7849 Other hyperlipidemia: Secondary | ICD-10-CM | POA: Diagnosis not present

## 2019-03-31 DIAGNOSIS — E1129 Type 2 diabetes mellitus with other diabetic kidney complication: Secondary | ICD-10-CM | POA: Diagnosis not present

## 2019-03-31 DIAGNOSIS — Z0001 Encounter for general adult medical examination with abnormal findings: Secondary | ICD-10-CM | POA: Diagnosis not present

## 2019-03-31 DIAGNOSIS — I10 Essential (primary) hypertension: Secondary | ICD-10-CM | POA: Diagnosis not present

## 2019-03-31 DIAGNOSIS — N182 Chronic kidney disease, stage 2 (mild): Secondary | ICD-10-CM | POA: Diagnosis not present

## 2019-03-31 DIAGNOSIS — E6609 Other obesity due to excess calories: Secondary | ICD-10-CM | POA: Diagnosis not present

## 2019-03-31 DIAGNOSIS — E2839 Other primary ovarian failure: Secondary | ICD-10-CM

## 2019-04-19 ENCOUNTER — Telehealth: Payer: Self-pay | Admitting: *Deleted

## 2019-04-19 NOTE — Telephone Encounter (Signed)
Called and left the patient a message to call the office back. Need to move her appt from 8/19 to 8/5

## 2019-04-19 NOTE — Telephone Encounter (Signed)
Returned the patient's call and moved her appt from 8/19 to 8/5

## 2019-05-04 ENCOUNTER — Other Ambulatory Visit: Payer: Self-pay | Admitting: Gynecologic Oncology

## 2019-05-04 DIAGNOSIS — C541 Malignant neoplasm of endometrium: Secondary | ICD-10-CM

## 2019-05-17 ENCOUNTER — Telehealth: Payer: Self-pay | Admitting: Gynecology

## 2019-05-17 ENCOUNTER — Inpatient Hospital Stay: Payer: Medicare Other | Attending: Gynecology | Admitting: Gynecology

## 2019-05-17 NOTE — Telephone Encounter (Signed)
Patient had a phone visit scheduled.  I called twice at 1015, 1030 and 1600.  All calls went to voice mail. Given her last pap showing atypical cells, I would like her to have a repeat pap in the next few weeks.

## 2019-05-31 ENCOUNTER — Ambulatory Visit: Payer: Medicare Other | Admitting: Gynecology

## 2019-06-01 ENCOUNTER — Ambulatory Visit: Payer: Self-pay | Admitting: Radiation Oncology

## 2019-06-13 ENCOUNTER — Ambulatory Visit: Payer: Medicare Other | Admitting: Gynecologic Oncology

## 2019-06-18 NOTE — Progress Notes (Signed)
Consult Note: Gyn-Onc   [name] 68 y.o. female  No chief complaint on file.   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  She was initially diagnosed in 2003 and had a recurrence in 2005. She was then NED until 07/2018.  Clinically, she has no evidence of recurrent disease today.  I will follow-up on results of her Pap smear from today.  She was encouraged to use her dilator secondary to progressive agglutination of the vagina.  She was encouraged to use it 3 times a week.  She knows that we will schedule her follow-up based on the results from today.  HPI  In 2003 the patient underwent abdominal hysterectomy for a stage IB grade 1 endometrial carcinoma. With good prognostic features she had no subsequent therapy. 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 These were biopsied. She was then treated with 6 cycles of carboplatin and Taxol had a partial response. Subsequently she was placed on Megace 40 mg 3 times a day. She then had a complete response with resolution of all pulmonary nodules. She's continued to take Megace for subsequent 10 years. She also received pelvic radiation therapy to treat the vaginal recurrence.  September 2019 the patient presented with a exophytic vaginal recurrence.  CT scan showed no other evidence of metastatic disease.  This isolated lesion was treated with excision and then IM RT to the central pelvis to a dose of 45 Gy and additional HDR brachy therapy completed on October 13, 2018. She comes in today for follow up.  Interval History:  The patient returns today having completed RT to the central pelvis to a dose of 45 Gy and additional HDR brachy therapy completed on October 13, 2018.    I saw her last December 29, 2018.  Her exam was unremarkable but her Pap smear revealed atypical glandular cells.  I saw her last in June and her colpo was negative. She  comes in today for a follow up pap smear.  She is overall doing fairly well.  She did change the time that she takes her cinnamon and tumor rec and for the last 4 nights has gotten up 1 or 2 times to empty her bladder but she was dry meaning she did not have any leakage like she did before.  She denies any change in her bowel or bladder habits.  She denies any vaginal bleeding.  She admits to not using her vaginal dilator.  Discussed the importance of doing so.   Allergies  Allergen Reactions  . Compazine Other (See Comments)    Pt does not want med due to strange side effects     Past Medical History:  Diagnosis Date  . Aortic atherosclerosis ([place]) 05/2018  . Aortic valve sclerosis 01/14/2015   Moderate without stenosis, noted on ECHO  . Diabetes mellitus   . Diverticulosis 12/18/2016   Mild, noted on colonoscopy  . Endometrial cancer ([place])    Metastatic, Stage IB grade 1, recurrent  . Grade I diastolic dysfunction 82/99/3716   noted on ECHO  . Heart murmur   . History of cardiomegaly 2003  . History of colon polyps   . Hypertension   . Internal hemorrhoids 12/18/2016   noted on colonoscopy  . LAE (left atrial enlargement) 01/14/2015   Mild, noted on ECHO  . Lung cancer ([place])   . LVH (left ventricular hypertrophy) 01/24/2015   Mild, noted on  ECHO  . Mass of right lung   . Obesity   . Pneumothorax on right 2005   History of   . PONV (postoperative nausea and vomiting)   . Pulmonary nodules    Bilateral  . RBBB (right bundle branch block)   . Renal cyst, left    pt unaware  . Spondylosis    Mild, lumbar  . Wears contact lenses   . Wears glasses   . Wears partial dentures    upper and lower    Past Surgical History:  Procedure Laterality Date  . ABDOMINAL HYSTERECTOMY  04/2002   TAH  . BILATERAL OOPHORECTOMY  1998  . BREAST SURGERY     reduction  . CESAREAN SECTION     x2  . CHOLECYSTECTOMY    . COLONOSCOPY N/A 12/18/2016   Procedure: COLONOSCOPY;  Surgeon:  Danie Binder, MD;  Location: [facility];  Service: Endoscopy;  Laterality: N/A;  9:45 AM  . REDUCTION MAMMAPLASTY Bilateral   . thoracic lung biopsy Left   . TONSILLECTOMY      Current Outpatient Medications  Medication Sig Dispense Refill  . aspirin EC 81 MG tablet Take 81 mg by mouth daily.    . Calcium Carb-Cholecalciferol (CALCIUM 500+D PO) Take 1 tablet by mouth daily.    . cholecalciferol (VITAMIN D) 1000 UNITS tablet Take 1,000 Units by mouth daily.    Marland Kitchen CINNAMON PO Take 1,000 mg by mouth daily.     . diclofenac (VOLTAREN) 50 MG EC tablet   0  . Garlic 4098 MG CAPS Take 1 capsule by mouth daily.     Marland Kitchen glipiZIDE (GLIPIZIDE XL) 10 MG 24 hr tablet Take 10 mg by mouth daily.     Marland Kitchen lisinopril (PRINIVIL,ZESTRIL) 20 MG tablet Take 20 mg by mouth daily.    . megestrol (MEGACE) 40 MG tablet TAKE (1) TABLET BY MOUTH (3) TIMES DAILY. 90 tablet 3  . metFORMIN (GLUCOPHAGE) 1000 MG tablet Take 1,000 mg by mouth 2 (two) times daily with a meal.     . Multiple Vitamin (MULTI VITAMIN DAILY PO) Take 1 each by mouth daily.    . NON FORMULARY Dark Cherry Extract    . Omega-3 Fatty Acids (FISH OIL) 1000 MG CAPS Take 1 each by mouth daily.     . simvastatin (ZOCOR) 20 MG tablet Take 20 mg by mouth every evening.     . Turmeric 500 MG CAPS Take 1 capsule by mouth daily.     . vitamin C (ASCORBIC ACID) 500 MG tablet Take 500 mg by mouth daily.     No current facility-administered medications for this visit.     Social History   Socioeconomic History  . Marital status: Divorced    Spouse name: Not on file  . Number of children: Not on file  . Years of education: Not on file  . Highest education level: Not on file  Occupational History  . Not on file  Social Needs  . Financial resource strain: Not on file  . Food insecurity    Worry: Not on file    Inability: Not on file  . Transportation needs    Medical: Not on file    Non-medical: Not on file  Tobacco Use  . Smoking status: Never  Smoker  . Smokeless tobacco: Never Used  Substance and Sexual Activity  . Alcohol use: No  . Drug use: No  . Sexual activity: Not Currently    Birth control/protection: Surgical  Lifestyle  . Physical activity    Days per week: Not on file    Minutes per session: Not on file  . Stress: Not on file  Relationships  . Social Herbalist on phone: Not on file    Gets together: Not on file    Attends religious service: Not on file    Active member of club or organization: Not on file    Attends meetings of clubs or organizations: Not on file    Relationship status: Not on file  . Intimate partner violence    Fear of current or ex partner: Not on file    Emotionally abused: Not on file    Physically abused: Not on file    Forced sexual activity: Not on file  Other Topics Concern  . Not on file  Social History Narrative  . Not on file    Family History  Problem Relation Age of Onset  . Ovarian cancer Mother   . Heart Problems Father   . Coronary artery disease Brother    Vitals: Please refer to Epic  Physical Exam: General : The patient is a healthy obese woman in no acute distress.   Pelvic: External genitalia within normal limits.  Vagina is markedly atrophic.  The vaginal cuff is visualized.  There is no visible lesions.  There is no discharge.  Pap smear submitted without difficulty.  Colposcopic evaluation was performed.  There are markedly atrophic changes throughout the vagina and she tolerates the exam poorly.  With colposcopy there are no visible lesions.  There was a little bit of bleeding secondary to the vagina being more agglutinated.  On bimanual examination there is no nodularity or thickening.  Exam is best appreciated on rectal exam secondary to the agglutination of the vagina.    Oseias Horsey A., MD 06/18/2019, 6:24 PM

## 2019-06-21 ENCOUNTER — Inpatient Hospital Stay: Payer: Medicare Other | Attending: Gynecology | Admitting: Gynecologic Oncology

## 2019-06-21 ENCOUNTER — Other Ambulatory Visit: Payer: Self-pay

## 2019-06-21 ENCOUNTER — Other Ambulatory Visit (HOSPITAL_COMMUNITY)
Admission: RE | Admit: 2019-06-21 | Discharge: 2019-06-21 | Disposition: A | Discharge status: Home / Self Care | Payer: Medicare Other | Source: Ambulatory Visit | Attending: Gynecologic Oncology | Admitting: Gynecologic Oncology

## 2019-06-21 VITALS — BP 158/70 | HR 77 | Temp 97.8°F | Resp 20 | Ht 67.0 in | Wt 208.0 lb

## 2019-06-21 DIAGNOSIS — Z79899 Other long term (current) drug therapy: Secondary | ICD-10-CM | POA: Insufficient documentation

## 2019-06-21 DIAGNOSIS — Z79818 Long term (current) use of other agents affecting estrogen receptors and estrogen levels: Secondary | ICD-10-CM

## 2019-06-21 DIAGNOSIS — Z923 Personal history of irradiation: Secondary | ICD-10-CM

## 2019-06-21 DIAGNOSIS — Z7982 Long term (current) use of aspirin: Secondary | ICD-10-CM | POA: Diagnosis not present

## 2019-06-21 DIAGNOSIS — E669 Obesity, unspecified: Secondary | ICD-10-CM | POA: Diagnosis not present

## 2019-06-21 DIAGNOSIS — R011 Cardiac murmur, unspecified: Secondary | ICD-10-CM | POA: Diagnosis not present

## 2019-06-21 DIAGNOSIS — Z90722 Acquired absence of ovaries, bilateral: Secondary | ICD-10-CM | POA: Diagnosis not present

## 2019-06-21 DIAGNOSIS — Z7984 Long term (current) use of oral hypoglycemic drugs: Secondary | ICD-10-CM | POA: Insufficient documentation

## 2019-06-21 DIAGNOSIS — C541 Malignant neoplasm of endometrium: Secondary | ICD-10-CM | POA: Insufficient documentation

## 2019-06-21 DIAGNOSIS — Z90710 Acquired absence of both cervix and uterus: Secondary | ICD-10-CM | POA: Diagnosis not present

## 2019-06-21 DIAGNOSIS — I10 Essential (primary) hypertension: Secondary | ICD-10-CM | POA: Diagnosis not present

## 2019-06-21 DIAGNOSIS — Z9221 Personal history of antineoplastic chemotherapy: Secondary | ICD-10-CM | POA: Diagnosis not present

## 2019-06-21 DIAGNOSIS — I700 Atherosclerosis of aorta: Secondary | ICD-10-CM | POA: Diagnosis not present

## 2019-06-21 DIAGNOSIS — E119 Type 2 diabetes mellitus without complications: Secondary | ICD-10-CM | POA: Diagnosis not present

## 2019-06-21 DIAGNOSIS — Z791 Long term (current) use of non-steroidal anti-inflammatories (NSAID): Secondary | ICD-10-CM | POA: Diagnosis not present

## 2019-06-21 DIAGNOSIS — C7982 Secondary malignant neoplasm of genital organs: Secondary | ICD-10-CM

## 2019-06-21 DIAGNOSIS — C7800 Secondary malignant neoplasm of unspecified lung: Secondary | ICD-10-CM

## 2019-06-21 DIAGNOSIS — I358 Other nonrheumatic aortic valve disorders: Secondary | ICD-10-CM | POA: Insufficient documentation

## 2019-06-21 NOTE — Addendum Note (Signed)
Addended by: Joylene John D on: 06/21/2019 02:09 PM   Modules accepted: Orders

## 2019-06-21 NOTE — Patient Instructions (Signed)
We will notify you of the results of your Pap smear from today.

## 2019-06-26 LAB — CYTOLOGY - PAP: Diagnosis: NEGATIVE

## 2019-06-27 ENCOUNTER — Telehealth: Payer: Self-pay

## 2019-06-27 NOTE — Telephone Encounter (Signed)
I told [name] that her PAP smear results were normal.  I told her Dr. Syble Creek wants to see her 3 months after Dr. Sondra Come appt. Pt verbalized understanding.

## 2019-06-30 DIAGNOSIS — I10 Essential (primary) hypertension: Secondary | ICD-10-CM | POA: Diagnosis not present

## 2019-06-30 DIAGNOSIS — E1129 Type 2 diabetes mellitus with other diabetic kidney complication: Secondary | ICD-10-CM | POA: Diagnosis not present

## 2019-06-30 DIAGNOSIS — Z6837 Body mass index (BMI) 37.0-37.9, adult: Secondary | ICD-10-CM | POA: Diagnosis not present

## 2019-06-30 DIAGNOSIS — E7849 Other hyperlipidemia: Secondary | ICD-10-CM | POA: Diagnosis not present

## 2019-06-30 DIAGNOSIS — E1165 Type 2 diabetes mellitus with hyperglycemia: Secondary | ICD-10-CM | POA: Diagnosis not present

## 2019-06-30 DIAGNOSIS — E6609 Other obesity due to excess calories: Secondary | ICD-10-CM | POA: Diagnosis not present

## 2019-07-04 DIAGNOSIS — Z6837 Body mass index (BMI) 37.0-37.9, adult: Secondary | ICD-10-CM | POA: Diagnosis not present

## 2019-07-04 DIAGNOSIS — E119 Type 2 diabetes mellitus without complications: Secondary | ICD-10-CM | POA: Diagnosis not present

## 2019-07-04 DIAGNOSIS — E113492 Type 2 diabetes mellitus with severe nonproliferative diabetic retinopathy without macular edema, left eye: Secondary | ICD-10-CM | POA: Diagnosis not present

## 2019-07-10 DIAGNOSIS — R0981 Nasal congestion: Secondary | ICD-10-CM | POA: Diagnosis not present

## 2019-07-10 DIAGNOSIS — Z20828 Contact with and (suspected) exposure to other viral communicable diseases: Secondary | ICD-10-CM | POA: Diagnosis not present

## 2019-07-10 DIAGNOSIS — R51 Headache: Secondary | ICD-10-CM | POA: Diagnosis not present

## 2019-07-12 DIAGNOSIS — Z23 Encounter for immunization: Secondary | ICD-10-CM | POA: Diagnosis not present

## 2019-07-21 ENCOUNTER — Other Ambulatory Visit: Payer: Self-pay

## 2019-07-21 ENCOUNTER — Ambulatory Visit (INDEPENDENT_AMBULATORY_CARE_PROVIDER_SITE_OTHER): Payer: Medicare Other | Admitting: Urology

## 2019-07-21 DIAGNOSIS — R351 Nocturia: Secondary | ICD-10-CM | POA: Diagnosis not present

## 2019-07-21 DIAGNOSIS — N3941 Urge incontinence: Secondary | ICD-10-CM

## 2019-07-21 DIAGNOSIS — N952 Postmenopausal atrophic vaginitis: Secondary | ICD-10-CM | POA: Diagnosis not present

## 2019-07-21 DIAGNOSIS — R3121 Asymptomatic microscopic hematuria: Secondary | ICD-10-CM | POA: Diagnosis not present

## 2019-07-26 ENCOUNTER — Other Ambulatory Visit (HOSPITAL_COMMUNITY)
Admission: RE | Admit: 2019-07-26 | Discharge: 2019-07-26 | Disposition: A | Discharge status: Home / Self Care | Payer: Medicare Other | Source: Ambulatory Visit | Attending: Urology | Admitting: Urology

## 2019-07-26 DIAGNOSIS — R3121 Asymptomatic microscopic hematuria: Secondary | ICD-10-CM | POA: Diagnosis not present

## 2019-07-26 LAB — URINALYSIS, COMPLETE (UACMP) WITH MICROSCOPIC
Bilirubin Urine: NEGATIVE
Glucose, UA: NEGATIVE mg/dL
Hgb urine dipstick: NEGATIVE
Ketones, ur: NEGATIVE mg/dL
Leukocytes,Ua: NEGATIVE
Nitrite: NEGATIVE
Protein, ur: 100 mg/dL — AB
Specific Gravity, Urine: 1.01 (ref 1.005–1.030)
pH: 5 (ref 5.0–8.0)

## 2019-09-01 ENCOUNTER — Ambulatory Visit: Payer: Medicare Other | Admitting: Urology

## 2019-09-11 ENCOUNTER — Other Ambulatory Visit: Payer: Self-pay | Admitting: Gynecologic Oncology

## 2019-09-11 DIAGNOSIS — C541 Malignant neoplasm of endometrium: Secondary | ICD-10-CM

## 2019-09-14 DIAGNOSIS — E113413 Type 2 diabetes mellitus with severe nonproliferative diabetic retinopathy with macular edema, bilateral: Secondary | ICD-10-CM | POA: Diagnosis not present

## 2019-09-14 DIAGNOSIS — H35033 Hypertensive retinopathy, bilateral: Secondary | ICD-10-CM | POA: Diagnosis not present

## 2019-09-14 DIAGNOSIS — H43391 Other vitreous opacities, right eye: Secondary | ICD-10-CM | POA: Diagnosis not present

## 2019-09-14 DIAGNOSIS — H43813 Vitreous degeneration, bilateral: Secondary | ICD-10-CM | POA: Diagnosis not present

## 2019-09-22 ENCOUNTER — Ambulatory Visit: Payer: Medicare Other | Admitting: Urology

## 2019-10-02 ENCOUNTER — Ambulatory Visit: Payer: Medicare Other | Admitting: Radiation Oncology

## 2019-10-02 ENCOUNTER — Telehealth: Payer: Self-pay | Admitting: *Deleted

## 2019-10-02 NOTE — Telephone Encounter (Signed)
CALLED PATIENT TO INFORM OF FU ON 10-09-19 BEING ALTERED TO 10-18-19 @ 4 PM, LVM FOR  A RETURN CALL

## 2019-10-09 ENCOUNTER — Ambulatory Visit: Payer: Medicare Other | Admitting: Radiation Oncology

## 2019-10-18 ENCOUNTER — Ambulatory Visit
Admission: RE | Admit: 2019-10-18 | Discharge: 2019-10-18 | Disposition: A | Discharge status: Home / Self Care | Payer: Medicare Other | Source: Ambulatory Visit | Attending: Radiation Oncology | Admitting: Radiation Oncology

## 2019-11-16 DIAGNOSIS — Z23 Encounter for immunization: Secondary | ICD-10-CM | POA: Diagnosis not present

## 2019-12-14 DIAGNOSIS — H43813 Vitreous degeneration, bilateral: Secondary | ICD-10-CM | POA: Diagnosis not present

## 2019-12-14 DIAGNOSIS — H43391 Other vitreous opacities, right eye: Secondary | ICD-10-CM | POA: Diagnosis not present

## 2019-12-14 DIAGNOSIS — H35033 Hypertensive retinopathy, bilateral: Secondary | ICD-10-CM | POA: Diagnosis not present

## 2019-12-14 DIAGNOSIS — E113413 Type 2 diabetes mellitus with severe nonproliferative diabetic retinopathy with macular edema, bilateral: Secondary | ICD-10-CM | POA: Diagnosis not present

## 2019-12-15 DIAGNOSIS — Z23 Encounter for immunization: Secondary | ICD-10-CM | POA: Diagnosis not present

## 2019-12-21 ENCOUNTER — Other Ambulatory Visit: Payer: Self-pay | Admitting: Gynecologic Oncology

## 2019-12-21 DIAGNOSIS — C541 Malignant neoplasm of endometrium: Secondary | ICD-10-CM

## 2020-01-04 IMAGING — MG DIGITAL SCREENING BILATERAL MAMMOGRAM WITH TOMO AND CAD
8 of 14 series · 8 of 40 positions shown · non-contrast
Comparison: Previous exam(s).

ACR Breast Density Category a: The breast tissue is almost entirely
fatty.

CLINICAL DATA: Screening.

EXAM:
DIGITAL SCREENING BILATERAL MAMMOGRAM WITH TOMO AND CAD

[L MLO synth-2D (1 of 2)]
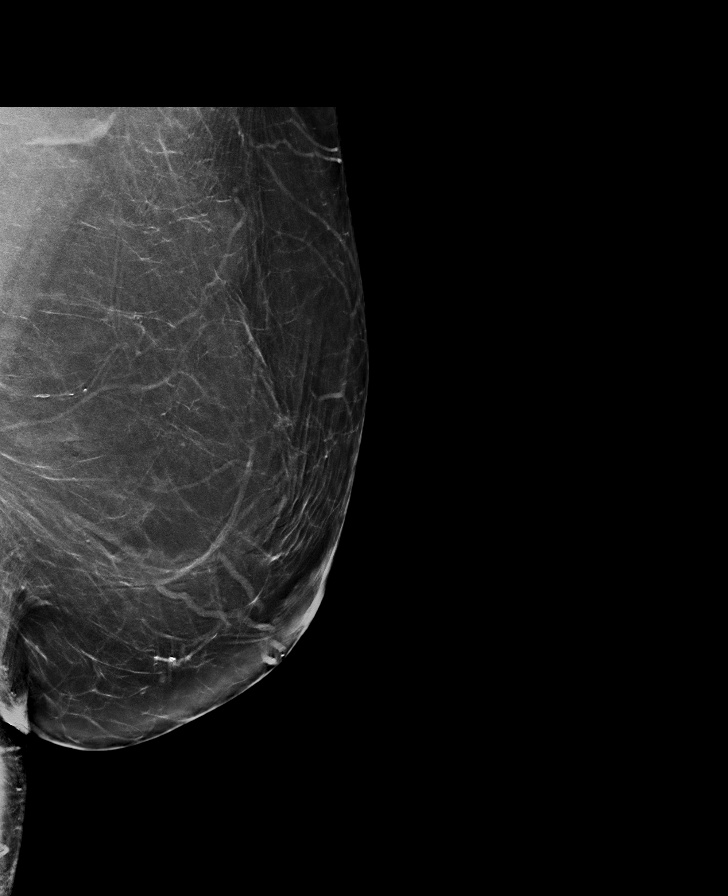

[L CC synth-2D (1 of 2)]
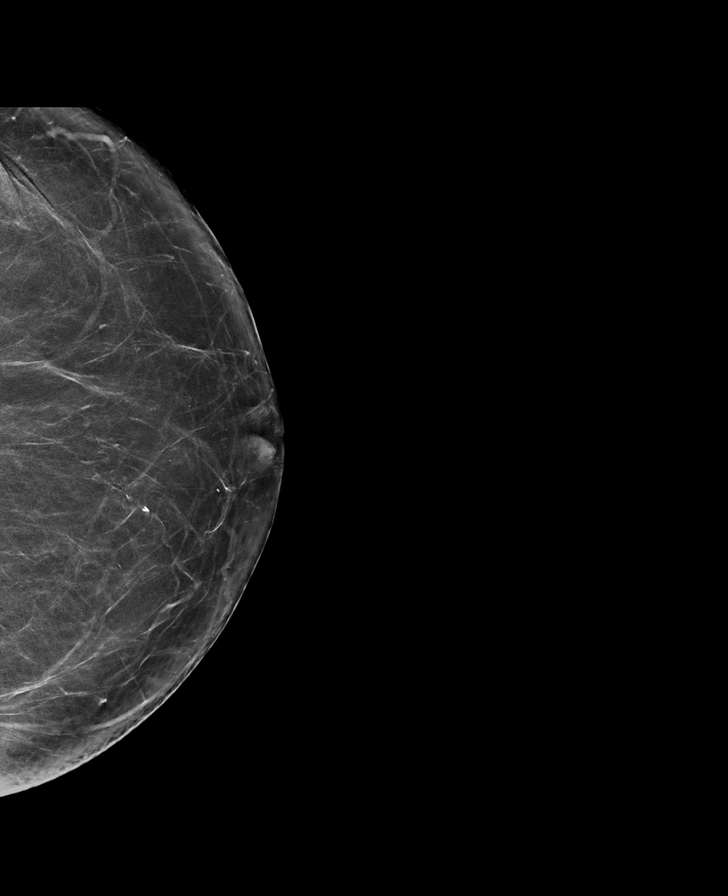

[L CC synth-2D (2 of 2)]
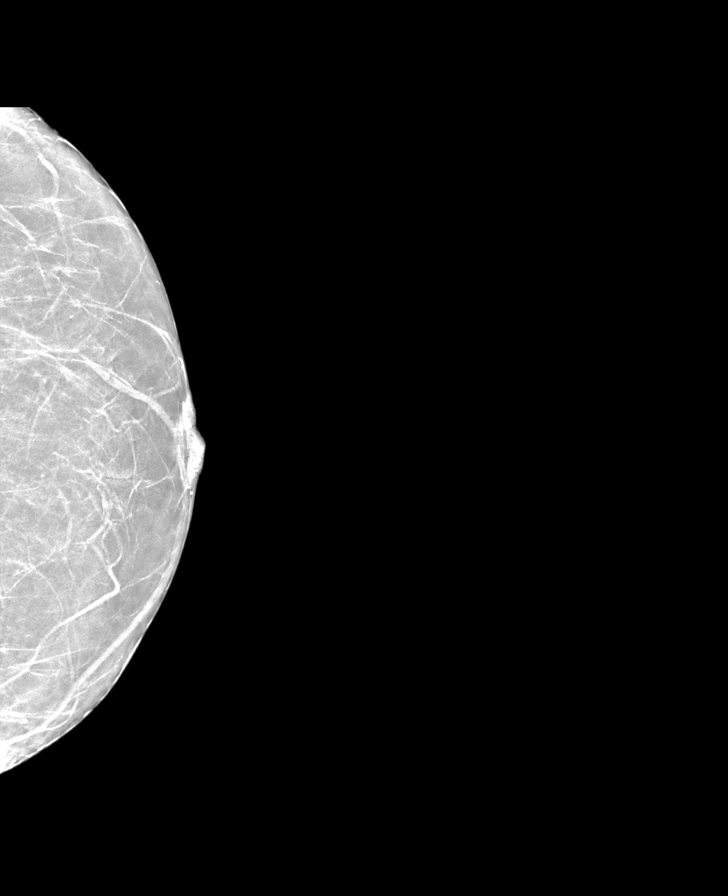

[R MLO synth-2D]
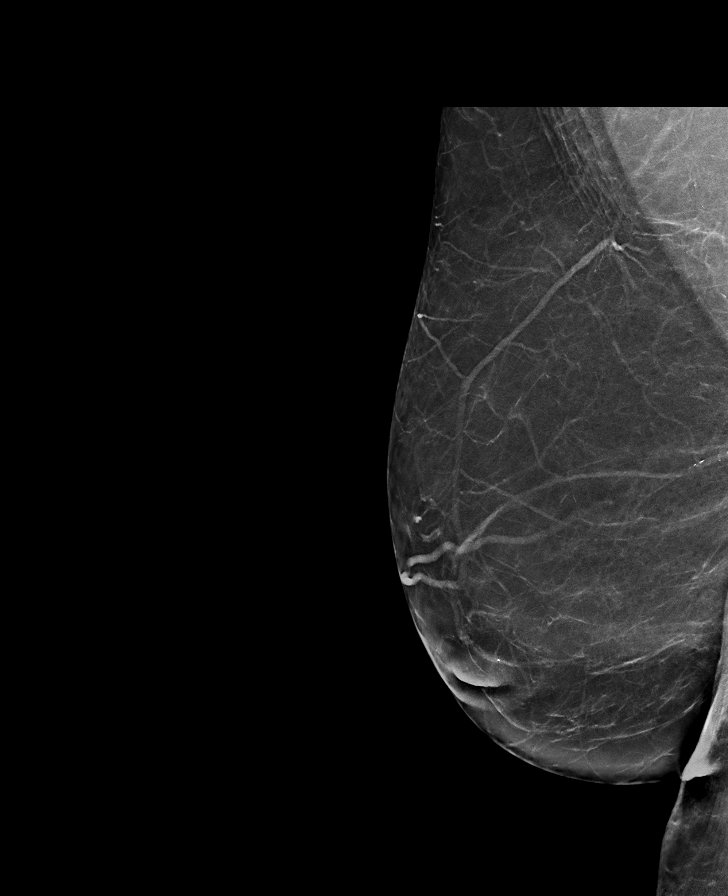

[R CC synth-2D (1 of 2)]
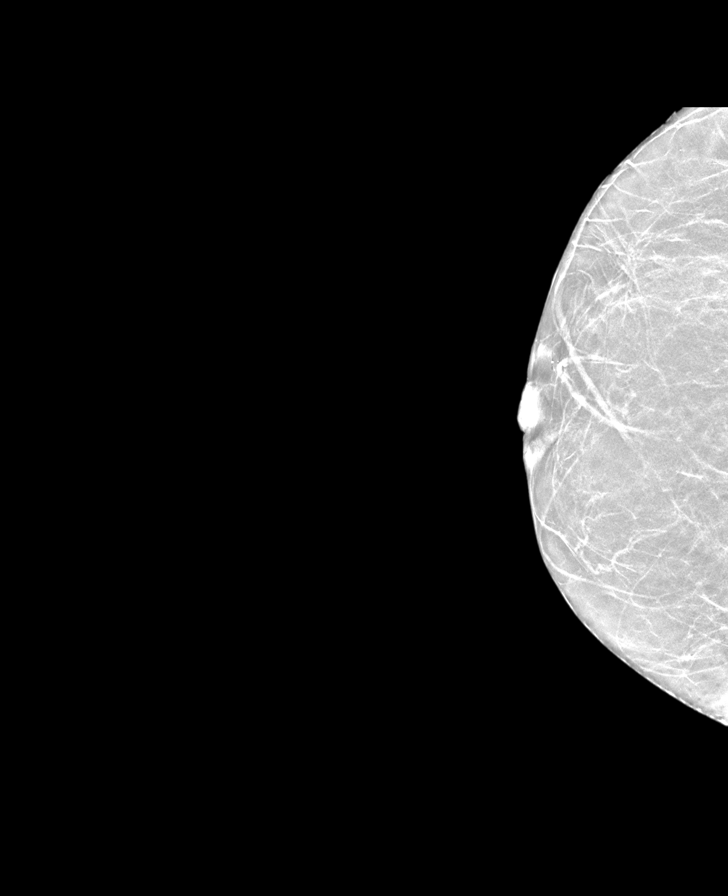

[L MLO synth-2D (2 of 2)]
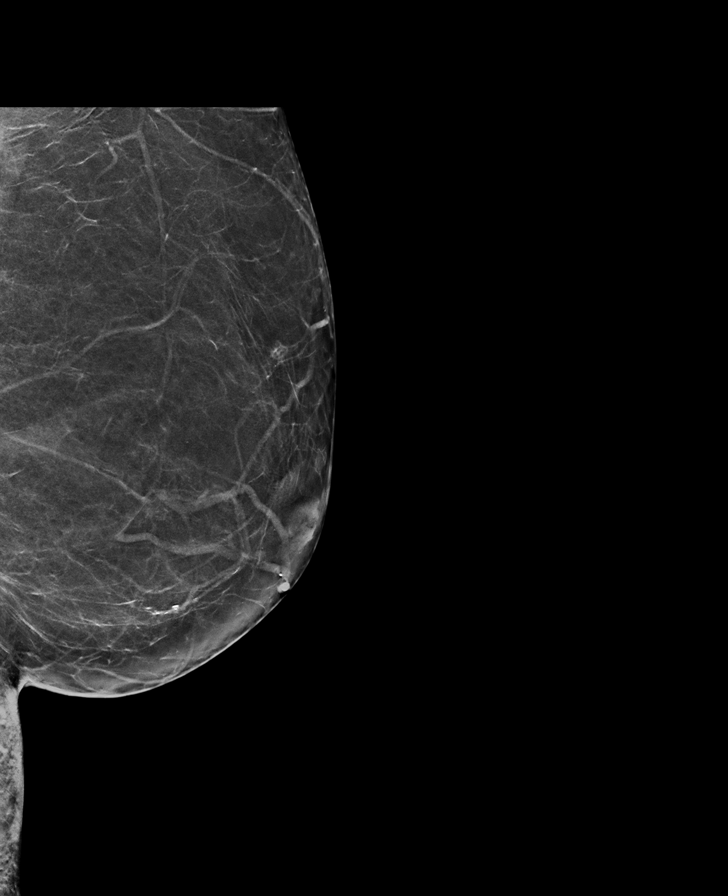

[R CC synth-2D (2 of 2)]
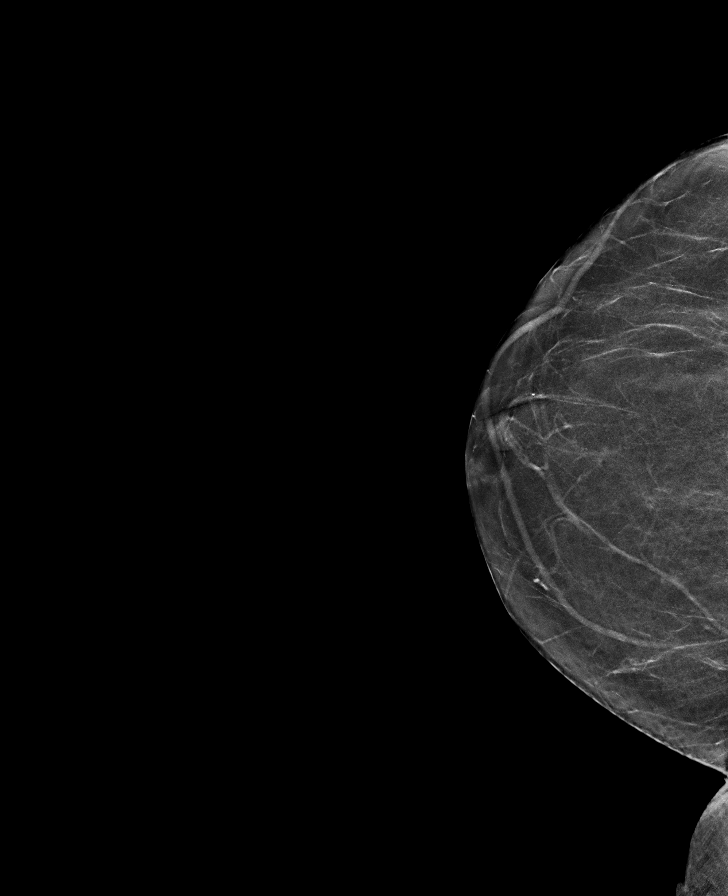

[L CC tomo · tomo slice 37/72.0]
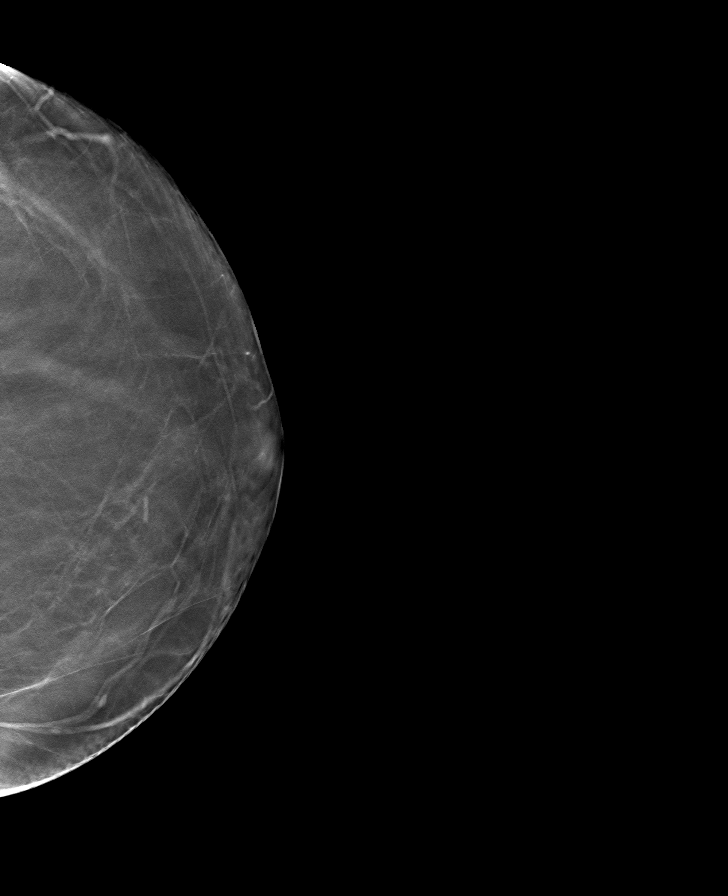

[8 of 40 positions shown; findings below may reference images not displayed]

FINDINGS: There are no findings suspicious for malignancy. Images were
processed with CAD.
IMPRESSION: No mammographic evidence of malignancy. A result letter of this
screening mammogram will be mailed directly to the patient.

RECOMMENDATION:
Screening mammogram in one year. (Code:[id number]-[id number])

BI-RADS CATEGORY  1: Negative.

## 2020-01-17 DIAGNOSIS — E113413 Type 2 diabetes mellitus with severe nonproliferative diabetic retinopathy with macular edema, bilateral: Secondary | ICD-10-CM | POA: Diagnosis not present

## 2020-01-18 DIAGNOSIS — Z6837 Body mass index (BMI) 37.0-37.9, adult: Secondary | ICD-10-CM | POA: Diagnosis not present

## 2020-01-18 DIAGNOSIS — R600 Localized edema: Secondary | ICD-10-CM | POA: Diagnosis not present

## 2020-01-18 DIAGNOSIS — E1129 Type 2 diabetes mellitus with other diabetic kidney complication: Secondary | ICD-10-CM | POA: Diagnosis not present

## 2020-01-18 DIAGNOSIS — I10 Essential (primary) hypertension: Secondary | ICD-10-CM | POA: Diagnosis not present

## 2020-01-18 DIAGNOSIS — E7849 Other hyperlipidemia: Secondary | ICD-10-CM | POA: Diagnosis not present

## 2020-01-19 DIAGNOSIS — Z1389 Encounter for screening for other disorder: Secondary | ICD-10-CM | POA: Diagnosis not present

## 2020-01-19 DIAGNOSIS — R600 Localized edema: Secondary | ICD-10-CM | POA: Diagnosis not present

## 2020-01-19 DIAGNOSIS — Z6837 Body mass index (BMI) 37.0-37.9, adult: Secondary | ICD-10-CM | POA: Diagnosis not present

## 2020-02-22 DIAGNOSIS — H43813 Vitreous degeneration, bilateral: Secondary | ICD-10-CM | POA: Diagnosis not present

## 2020-02-22 DIAGNOSIS — H35033 Hypertensive retinopathy, bilateral: Secondary | ICD-10-CM | POA: Diagnosis not present

## 2020-02-22 DIAGNOSIS — E113413 Type 2 diabetes mellitus with severe nonproliferative diabetic retinopathy with macular edema, bilateral: Secondary | ICD-10-CM | POA: Diagnosis not present

## 2020-05-01 ENCOUNTER — Other Ambulatory Visit (HOSPITAL_COMMUNITY): Payer: Self-pay | Admitting: Family Medicine

## 2020-05-01 DIAGNOSIS — Z1231 Encounter for screening mammogram for malignant neoplasm of breast: Secondary | ICD-10-CM

## 2020-05-13 ENCOUNTER — Ambulatory Visit (HOSPITAL_COMMUNITY): Payer: Medicare Other

## 2020-05-16 DIAGNOSIS — Z853 Personal history of malignant neoplasm of breast: Secondary | ICD-10-CM | POA: Diagnosis not present

## 2020-05-16 DIAGNOSIS — N182 Chronic kidney disease, stage 2 (mild): Secondary | ICD-10-CM | POA: Diagnosis not present

## 2020-05-16 DIAGNOSIS — E1129 Type 2 diabetes mellitus with other diabetic kidney complication: Secondary | ICD-10-CM | POA: Diagnosis not present

## 2020-05-16 DIAGNOSIS — Z0000 Encounter for general adult medical examination without abnormal findings: Secondary | ICD-10-CM | POA: Diagnosis not present

## 2020-05-16 DIAGNOSIS — E6609 Other obesity due to excess calories: Secondary | ICD-10-CM | POA: Diagnosis not present

## 2020-05-16 DIAGNOSIS — Z1389 Encounter for screening for other disorder: Secondary | ICD-10-CM | POA: Diagnosis not present

## 2020-05-16 DIAGNOSIS — E7849 Other hyperlipidemia: Secondary | ICD-10-CM | POA: Diagnosis not present

## 2020-05-16 DIAGNOSIS — I10 Essential (primary) hypertension: Secondary | ICD-10-CM | POA: Diagnosis not present

## 2020-05-16 DIAGNOSIS — Z6835 Body mass index (BMI) 35.0-35.9, adult: Secondary | ICD-10-CM | POA: Diagnosis not present

## 2020-05-22 ENCOUNTER — Other Ambulatory Visit: Payer: Self-pay

## 2020-05-22 ENCOUNTER — Ambulatory Visit (HOSPITAL_COMMUNITY)
Admission: RE | Admit: 2020-05-22 | Discharge: 2020-05-22 | Disposition: A | Discharge status: Home / Self Care | Payer: Medicare Other | Source: Ambulatory Visit | Attending: Family Medicine | Admitting: Family Medicine

## 2020-05-22 DIAGNOSIS — Z1231 Encounter for screening mammogram for malignant neoplasm of breast: Secondary | ICD-10-CM | POA: Insufficient documentation

## 2020-05-28 DIAGNOSIS — E7849 Other hyperlipidemia: Secondary | ICD-10-CM | POA: Diagnosis not present

## 2020-05-28 DIAGNOSIS — E1129 Type 2 diabetes mellitus with other diabetic kidney complication: Secondary | ICD-10-CM | POA: Diagnosis not present

## 2020-05-28 DIAGNOSIS — N182 Chronic kidney disease, stage 2 (mild): Secondary | ICD-10-CM | POA: Diagnosis not present

## 2020-05-28 DIAGNOSIS — Z0000 Encounter for general adult medical examination without abnormal findings: Secondary | ICD-10-CM | POA: Diagnosis not present

## 2020-07-18 DIAGNOSIS — H43391 Other vitreous opacities, right eye: Secondary | ICD-10-CM | POA: Diagnosis not present

## 2020-07-18 DIAGNOSIS — H35033 Hypertensive retinopathy, bilateral: Secondary | ICD-10-CM | POA: Diagnosis not present

## 2020-07-18 DIAGNOSIS — E113313 Type 2 diabetes mellitus with moderate nonproliferative diabetic retinopathy with macular edema, bilateral: Secondary | ICD-10-CM | POA: Diagnosis not present

## 2020-07-18 DIAGNOSIS — H43813 Vitreous degeneration, bilateral: Secondary | ICD-10-CM | POA: Diagnosis not present

## 2020-07-30 DIAGNOSIS — Z23 Encounter for immunization: Secondary | ICD-10-CM | POA: Diagnosis not present

## 2020-08-16 DIAGNOSIS — Z23 Encounter for immunization: Secondary | ICD-10-CM | POA: Diagnosis not present

## 2020-09-13 DIAGNOSIS — E6609 Other obesity due to excess calories: Secondary | ICD-10-CM | POA: Diagnosis not present

## 2020-09-13 DIAGNOSIS — E119 Type 2 diabetes mellitus without complications: Secondary | ICD-10-CM | POA: Diagnosis not present

## 2020-09-13 DIAGNOSIS — E7849 Other hyperlipidemia: Secondary | ICD-10-CM | POA: Diagnosis not present

## 2020-09-13 DIAGNOSIS — I10 Essential (primary) hypertension: Secondary | ICD-10-CM | POA: Diagnosis not present

## 2020-09-13 DIAGNOSIS — Z6834 Body mass index (BMI) 34.0-34.9, adult: Secondary | ICD-10-CM | POA: Diagnosis not present

## 2020-09-13 DIAGNOSIS — E1165 Type 2 diabetes mellitus with hyperglycemia: Secondary | ICD-10-CM | POA: Diagnosis not present

## 2020-10-24 DIAGNOSIS — H43813 Vitreous degeneration, bilateral: Secondary | ICD-10-CM | POA: Diagnosis not present

## 2020-10-24 DIAGNOSIS — E113313 Type 2 diabetes mellitus with moderate nonproliferative diabetic retinopathy with macular edema, bilateral: Secondary | ICD-10-CM | POA: Diagnosis not present

## 2020-10-24 DIAGNOSIS — H43391 Other vitreous opacities, right eye: Secondary | ICD-10-CM | POA: Diagnosis not present

## 2020-10-24 DIAGNOSIS — H35033 Hypertensive retinopathy, bilateral: Secondary | ICD-10-CM | POA: Diagnosis not present

## 2021-01-02 DIAGNOSIS — I10 Essential (primary) hypertension: Secondary | ICD-10-CM | POA: Diagnosis not present

## 2021-01-02 DIAGNOSIS — E7849 Other hyperlipidemia: Secondary | ICD-10-CM | POA: Diagnosis not present

## 2021-01-02 DIAGNOSIS — E119 Type 2 diabetes mellitus without complications: Secondary | ICD-10-CM | POA: Diagnosis not present

## 2021-01-02 DIAGNOSIS — Z23 Encounter for immunization: Secondary | ICD-10-CM | POA: Diagnosis not present

## 2021-01-02 DIAGNOSIS — Z6834 Body mass index (BMI) 34.0-34.9, adult: Secondary | ICD-10-CM | POA: Diagnosis not present

## 2021-01-02 DIAGNOSIS — E1129 Type 2 diabetes mellitus with other diabetic kidney complication: Secondary | ICD-10-CM | POA: Diagnosis not present

## 2021-04-18 DIAGNOSIS — Z6833 Body mass index (BMI) 33.0-33.9, adult: Secondary | ICD-10-CM | POA: Diagnosis not present

## 2021-04-18 DIAGNOSIS — E6609 Other obesity due to excess calories: Secondary | ICD-10-CM | POA: Diagnosis not present

## 2021-04-18 DIAGNOSIS — E119 Type 2 diabetes mellitus without complications: Secondary | ICD-10-CM | POA: Diagnosis not present

## 2021-04-18 DIAGNOSIS — E782 Mixed hyperlipidemia: Secondary | ICD-10-CM | POA: Diagnosis not present

## 2021-04-18 DIAGNOSIS — C541 Malignant neoplasm of endometrium: Secondary | ICD-10-CM | POA: Diagnosis not present

## 2021-04-22 ENCOUNTER — Other Ambulatory Visit (HOSPITAL_COMMUNITY): Payer: Self-pay | Admitting: Family Medicine

## 2021-04-22 DIAGNOSIS — E2839 Other primary ovarian failure: Secondary | ICD-10-CM

## 2021-04-24 DIAGNOSIS — E113393 Type 2 diabetes mellitus with moderate nonproliferative diabetic retinopathy without macular edema, bilateral: Secondary | ICD-10-CM | POA: Diagnosis not present

## 2021-04-24 DIAGNOSIS — H43391 Other vitreous opacities, right eye: Secondary | ICD-10-CM | POA: Diagnosis not present

## 2021-04-24 DIAGNOSIS — H43813 Vitreous degeneration, bilateral: Secondary | ICD-10-CM | POA: Diagnosis not present

## 2021-04-24 DIAGNOSIS — H35033 Hypertensive retinopathy, bilateral: Secondary | ICD-10-CM | POA: Diagnosis not present

## 2021-05-21 ENCOUNTER — Other Ambulatory Visit (HOSPITAL_COMMUNITY): Payer: Self-pay | Admitting: Family Medicine

## 2021-05-21 DIAGNOSIS — Z1231 Encounter for screening mammogram for malignant neoplasm of breast: Secondary | ICD-10-CM

## 2021-05-26 ENCOUNTER — Ambulatory Visit (HOSPITAL_COMMUNITY)
Admission: RE | Admit: 2021-05-26 | Discharge: 2021-05-26 | Disposition: A | Discharge status: Home / Self Care | Payer: Medicare Other | Source: Ambulatory Visit | Attending: Family Medicine | Admitting: Family Medicine

## 2021-05-26 ENCOUNTER — Other Ambulatory Visit: Payer: Self-pay

## 2021-05-26 DIAGNOSIS — M810 Age-related osteoporosis without current pathological fracture: Secondary | ICD-10-CM | POA: Insufficient documentation

## 2021-05-26 DIAGNOSIS — Z1231 Encounter for screening mammogram for malignant neoplasm of breast: Secondary | ICD-10-CM

## 2021-05-26 DIAGNOSIS — Z90722 Acquired absence of ovaries, bilateral: Secondary | ICD-10-CM | POA: Insufficient documentation

## 2021-05-26 DIAGNOSIS — E2839 Other primary ovarian failure: Secondary | ICD-10-CM | POA: Insufficient documentation

## 2021-05-26 DIAGNOSIS — M85852 Other specified disorders of bone density and structure, left thigh: Secondary | ICD-10-CM | POA: Diagnosis not present

## 2021-05-26 DIAGNOSIS — Z780 Asymptomatic menopausal state: Secondary | ICD-10-CM | POA: Diagnosis present

## 2021-06-26 DIAGNOSIS — Z1331 Encounter for screening for depression: Secondary | ICD-10-CM | POA: Diagnosis not present

## 2021-06-26 DIAGNOSIS — R0989 Other specified symptoms and signs involving the circulatory and respiratory systems: Secondary | ICD-10-CM | POA: Diagnosis not present

## 2021-06-26 DIAGNOSIS — E6609 Other obesity due to excess calories: Secondary | ICD-10-CM | POA: Diagnosis not present

## 2021-06-26 DIAGNOSIS — N182 Chronic kidney disease, stage 2 (mild): Secondary | ICD-10-CM | POA: Diagnosis not present

## 2021-06-26 DIAGNOSIS — I10 Essential (primary) hypertension: Secondary | ICD-10-CM | POA: Diagnosis not present

## 2021-06-26 DIAGNOSIS — E1129 Type 2 diabetes mellitus with other diabetic kidney complication: Secondary | ICD-10-CM | POA: Diagnosis not present

## 2021-06-26 DIAGNOSIS — Z6833 Body mass index (BMI) 33.0-33.9, adult: Secondary | ICD-10-CM | POA: Diagnosis not present

## 2021-06-26 DIAGNOSIS — E538 Deficiency of other specified B group vitamins: Secondary | ICD-10-CM | POA: Diagnosis not present

## 2021-06-26 DIAGNOSIS — Z0001 Encounter for general adult medical examination with abnormal findings: Secondary | ICD-10-CM | POA: Diagnosis not present

## 2021-07-16 DIAGNOSIS — E782 Mixed hyperlipidemia: Secondary | ICD-10-CM | POA: Diagnosis not present

## 2021-07-16 DIAGNOSIS — E538 Deficiency of other specified B group vitamins: Secondary | ICD-10-CM | POA: Diagnosis not present

## 2021-07-16 DIAGNOSIS — Z8542 Personal history of malignant neoplasm of other parts of uterus: Secondary | ICD-10-CM | POA: Diagnosis not present

## 2021-07-16 DIAGNOSIS — Z1389 Encounter for screening for other disorder: Secondary | ICD-10-CM | POA: Diagnosis not present

## 2021-07-16 DIAGNOSIS — Z0001 Encounter for general adult medical examination with abnormal findings: Secondary | ICD-10-CM | POA: Diagnosis not present

## 2021-07-30 DIAGNOSIS — Z23 Encounter for immunization: Secondary | ICD-10-CM | POA: Diagnosis not present

## 2021-10-23 DIAGNOSIS — H43393 Other vitreous opacities, bilateral: Secondary | ICD-10-CM | POA: Diagnosis not present

## 2021-10-23 DIAGNOSIS — H35033 Hypertensive retinopathy, bilateral: Secondary | ICD-10-CM | POA: Diagnosis not present

## 2021-10-23 DIAGNOSIS — E113393 Type 2 diabetes mellitus with moderate nonproliferative diabetic retinopathy without macular edema, bilateral: Secondary | ICD-10-CM | POA: Diagnosis not present

## 2021-10-23 DIAGNOSIS — H43813 Vitreous degeneration, bilateral: Secondary | ICD-10-CM | POA: Diagnosis not present

## 2021-10-24 ENCOUNTER — Telehealth: Payer: Self-pay | Admitting: Cardiology

## 2021-10-24 ENCOUNTER — Encounter: Payer: Self-pay | Admitting: Cardiology

## 2021-10-24 ENCOUNTER — Other Ambulatory Visit: Payer: Self-pay

## 2021-10-24 ENCOUNTER — Ambulatory Visit (INDEPENDENT_AMBULATORY_CARE_PROVIDER_SITE_OTHER): Payer: Medicare Other | Admitting: Cardiology

## 2021-10-24 VITALS — BP 162/78 | HR 83 | Ht 64.0 in | Wt 190.8 lb

## 2021-10-24 DIAGNOSIS — R0989 Other specified symptoms and signs involving the circulatory and respiratory systems: Secondary | ICD-10-CM | POA: Diagnosis not present

## 2021-10-24 DIAGNOSIS — R011 Cardiac murmur, unspecified: Secondary | ICD-10-CM

## 2021-10-24 NOTE — Progress Notes (Signed)
Clinical Summary [name] is a 71 y.o.female seen today as a new patient, last seen in 2016 in our office.   1. Heart murmur - 2016 echo showed aortic valve thickening without significant dysfunction   2. Carotid bruit - noted by pcp, referred for evaluation   3. HTN - has not taken all meds yet today - 06/2021 124/70 recent pcp visit.   4. Hyperlipidemia - she is on simvastatin - labs followed by pcp    Past Medical History:  Diagnosis Date   Aortic atherosclerosis ([place]) 05/2018   Aortic valve sclerosis 01/14/2015   Moderate without stenosis, noted on ECHO   Diabetes mellitus    Diverticulosis 12/18/2016   Mild, noted on colonoscopy   Endometrial cancer ([place])    Metastatic, Stage IB grade 1, recurrent   Grade I diastolic dysfunction 16/96/7893   noted on ECHO   Heart murmur    History of cardiomegaly 2003   History of colon polyps    Hypertension    Internal hemorrhoids 12/18/2016   noted on colonoscopy   LAE (left atrial enlargement) 01/14/2015   Mild, noted on ECHO   Lung cancer ([place])    LVH (left ventricular hypertrophy) 01/24/2015   Mild, noted on ECHO   Mass of right lung    Obesity    Pneumothorax on right 2005   History of    PONV (postoperative nausea and vomiting)    Pulmonary nodules    Bilateral   RBBB (right bundle [name] block)    Renal cyst, left    pt unaware   Spondylosis    Mild, lumbar   Wears contact lenses    Wears glasses    Wears partial dentures    upper and lower     Allergies  Allergen Reactions   Compazine Other (See Comments)    Pt does not want med due to strange side effects      Current Outpatient Medications  Medication Sig Dispense Refill   aspirin EC 81 MG tablet Take 81 mg by mouth daily.     Calcium Carb-Cholecalciferol (CALCIUM 500+D PO) Take 1 tablet by mouth daily.     cholecalciferol (VITAMIN D) 1000 UNITS tablet Take 1,000 Units by mouth daily.     CINNAMON PO Take 1,000 mg by mouth daily.       diclofenac (VOLTAREN) 50 MG EC tablet   0   Garlic 8101 MG CAPS Take 1 capsule by mouth daily.      glipiZIDE (GLIPIZIDE XL) 10 MG 24 hr tablet Take 10 mg by mouth daily.      lisinopril (PRINIVIL,ZESTRIL) 20 MG tablet Take 20 mg by mouth daily.     megestrol (MEGACE) 40 MG tablet TAKE (1) TABLET BY MOUTH (3) TIMES DAILY. 90 tablet 2   metFORMIN (GLUCOPHAGE) 1000 MG tablet Take 1,000 mg by mouth 2 (two) times daily with a meal.      metoprolol succinate (TOPROL-XL) 25 MG 24 hr tablet Take 25 mg by mouth daily.     Multiple Vitamin (MULTI VITAMIN DAILY PO) Take 1 each by mouth daily.     NON FORMULARY Dark Cherry Extract     Omega-3 Fatty Acids (FISH OIL) 1000 MG CAPS Take 1 each by mouth daily.      simvastatin (ZOCOR) 20 MG tablet Take 20 mg by mouth every evening.      Turmeric 500 MG CAPS Take 1 capsule by mouth daily.      vitamin  C (ASCORBIC ACID) 500 MG tablet Take 500 mg by mouth daily.     No current facility-administered medications for this visit.     Past Surgical History:  Procedure Laterality Date   ABDOMINAL HYSTERECTOMY  04/2002   TAH   BILATERAL OOPHORECTOMY  1998   BREAST SURGERY     reduction   CESAREAN SECTION     x2   CHOLECYSTECTOMY     COLONOSCOPY N/A 12/18/2016   Procedure: COLONOSCOPY;  Surgeon: Kathleen Binder, MD;  [facility];  Service: Endoscopy;  Laterality: N/A;  9:45 AM   REDUCTION MAMMAPLASTY Bilateral    thoracic lung biopsy Left    TONSILLECTOMY       Allergies  Allergen Reactions   Compazine Other (See Comments)    Pt does not want med due to strange side effects       Family History  Problem Relation Age of Onset   Ovarian cancer Mother    Heart Problems Father    Coronary artery disease Brother      Social History [name] reports that she has never smoked. She has never used smokeless tobacco. [name] reports no history of alcohol use.   Review of Systems CONSTITUTIONAL: No weight loss, fever, chills, weakness  or fatigue.  HEENT: Eyes: No visual loss, blurred vision, double vision or yellow sclerae.No hearing loss, sneezing, congestion, runny nose or sore throat.  SKIN: No rash or itching.  CARDIOVASCULAR: no chest pain, no palpitations.  RESPIRATORY: No shortness of breath, cough or sputum.  GASTROINTESTINAL: No anorexia, nausea, vomiting or diarrhea. No abdominal pain or blood.  GENITOURINARY: No burning on urination, no polyuria NEUROLOGICAL: No headache, dizziness, syncope, paralysis, ataxia, numbness or tingling in the extremities. No change in bowel or bladder control.  MUSCULOSKELETAL: No muscle, back pain, joint pain or stiffness.  LYMPHATICS: No enlarged nodes. No history of splenectomy.  PSYCHIATRIC: No history of depression or anxiety.  ENDOCRINOLOGIC: No reports of sweating, cold or heat intolerance. No polyuria or polydipsia.  Marland Kitchen   Physical Examination Today's Vitals   10/24/21 1050  BP: (!) 162/78  Pulse: 83  SpO2: 97%  Weight: 190 lb 12.8 oz (86.5 kg)  Height: 5\' 4"  (1.626 m)   Body mass index is 32.75 kg/m.  Gen: resting comfortably, no acute distress HEENT: no scleral icterus, pupils equal round and reactive, no palptable cervical adenopathy,  CV: RRR, 3/6 systolic murmur rusb, no jvd. +bilateral carotid bruit Resp: Clear to auscultation bilaterally GI: abdomen is soft, non-tender, non-distended, normal bowel sounds, no hepatosplenomegaly MSK: extremities are warm, no edema.  Skin: warm, no rash Neuro:  no focal deficits Psych: appropriate affect   Diagnostic Studies     Assessment and Plan   1. Heart murmur - 2016 echo with aortic valve thickening but no significant stenosis - will repeat echo to reevaluate, some progression of murmur  2. Carotid bruits - obtain carotid US  3. HTN - elevated today, has not taken all her bp meds yet. Was at goal at recent pcp visit - monitor at this time  4. Hyperlipidemia - continue simvastatin, request labs from  pcp  EKG shows SR, RBBB,LAFB  F/u pending echo and carotid US results  Arnoldo Lenis, M.D.

## 2021-10-24 NOTE — Telephone Encounter (Signed)
Checking percert on the following patient for testing scheduled at [facility].     ECHO CARTOID STUDY  11/06/2021

## 2021-10-24 NOTE — Addendum Note (Signed)
Addended by: Laurine Blazer on: 10/24/2021 01:10 PM   Modules accepted: Orders

## 2021-10-24 NOTE — Patient Instructions (Signed)
Medication Instructions:  Your physician recommends that you continue on your current medications as directed. Please refer to the Current Medication list given to you today.  *If you need a refill on your cardiac medications before your next appointment, please call your pharmacy*   Lab Work: None If you have labs (blood work) drawn today and your tests are completely normal, you will receive your results only by: [place] (if you have MyChart) OR A paper copy in the mail If you have any lab test that is abnormal or we need to change your treatment, we will call you to review the results.   Testing/Procedures: Your physician has requested that you have an echocardiogram. Echocardiography is a painless test that uses sound waves to create images of your heart. It provides your doctor with information about the size and shape of your heart and how well your hearts chambers and valves are working. This procedure takes approximately one hour. There are no restrictions for this procedure.  Your physician has requested that you have a carotid duplex. This test is an ultrasound of the carotid arteries in your neck. It looks at blood flow through these arteries that supply the brain with blood. Allow one hour for this exam. There are no restrictions or special instructions.    Follow-Up: At [facility], you and your health needs are our priority.  As part of our continuing mission to provide you with exceptional heart care, we have created designated Provider Care Teams.  These Care Teams include your primary Cardiologist (physician) and Advanced Practice Providers (APPs -  Physician Assistants and Nurse Practitioners) who all work together to provide you with the care you need, when you need it.  We recommend signing up for the patient portal called "MyChart".  Sign up information is provided on this After Visit Summary.  MyChart is used to connect with patients for Virtual Visits  (Telemedicine).  Patients are able to view lab/test results, encounter notes, upcoming appointments, etc.  Non-urgent messages can be sent to your provider as well.   To learn more about what you can do with MyChart, go to [email or web address].    Your next appointment:   Follow Up: Pending   :1}    Other Instructions

## 2021-11-06 ENCOUNTER — Ambulatory Visit (HOSPITAL_COMMUNITY)
Admission: RE | Admit: 2021-11-06 | Discharge: 2021-11-06 | Disposition: A | Discharge status: Home / Self Care | Payer: Medicare Other | Source: Ambulatory Visit | Attending: Cardiology | Admitting: Cardiology

## 2021-11-06 ENCOUNTER — Other Ambulatory Visit: Payer: Self-pay

## 2021-11-06 DIAGNOSIS — R011 Cardiac murmur, unspecified: Secondary | ICD-10-CM | POA: Diagnosis not present

## 2021-11-06 DIAGNOSIS — I6523 Occlusion and stenosis of bilateral carotid arteries: Secondary | ICD-10-CM | POA: Diagnosis not present

## 2021-11-06 DIAGNOSIS — R0989 Other specified symptoms and signs involving the circulatory and respiratory systems: Secondary | ICD-10-CM | POA: Insufficient documentation

## 2021-11-06 LAB — ECHOCARDIOGRAM COMPLETE
AR max vel: 1.77 cm2
AV Area VTI: 1.77 cm2
AV Area mean vel: 1.73 cm2
AV Mean grad: 8 mmHg
AV Peak grad: 12.8 mmHg
Ao pk vel: 1.79 m/s
Area-P 1/2: 3.17 cm2
S' Lateral: 2.2 cm

## 2021-11-06 NOTE — Progress Notes (Signed)
*  PRELIMINARY RESULTS* Echocardiogram 2D Echocardiogram has been performed.  [name] 11/06/2021, 12:16 PM

## 2021-11-14 ENCOUNTER — Telehealth: Payer: Self-pay | Admitting: *Deleted

## 2021-11-14 NOTE — Telephone Encounter (Signed)
Laurine Blazer, LPN  03/14/8176  1:16 PM EST Back to Top    Notified, copy to pcp.

## 2021-11-14 NOTE — Telephone Encounter (Signed)
-----   Message from Arnoldo Lenis, MD sent at 11/14/2021  8:56 AM EST ----- Heart pumping function looks good. She has some mild to moderate age related stiffness of the heart. Her aortic valve is just mildly thickened but overall working well. Overall echo looks good, no significant worrisome findings. F/u 1 year   Zandra Abts MD

## 2021-11-24 ENCOUNTER — Telehealth: Payer: Self-pay | Admitting: *Deleted

## 2021-11-24 NOTE — Telephone Encounter (Signed)
-----   Message from Arnoldo Lenis, MD sent at 11/23/2021  9:16 AM EST ----- No significant blockages on carotid US   Zandra Abts MD

## 2021-11-24 NOTE — Telephone Encounter (Signed)
Laurine Blazer, LPN  04/21/6578  0:38 PM EST Back to Top    Notified, copy to pcp.

## 2021-12-26 DIAGNOSIS — Z6832 Body mass index (BMI) 32.0-32.9, adult: Secondary | ICD-10-CM | POA: Diagnosis not present

## 2021-12-26 DIAGNOSIS — E782 Mixed hyperlipidemia: Secondary | ICD-10-CM | POA: Diagnosis not present

## 2021-12-26 DIAGNOSIS — B351 Tinea unguium: Secondary | ICD-10-CM | POA: Diagnosis not present

## 2021-12-26 DIAGNOSIS — E119 Type 2 diabetes mellitus without complications: Secondary | ICD-10-CM | POA: Diagnosis not present

## 2021-12-26 DIAGNOSIS — I10 Essential (primary) hypertension: Secondary | ICD-10-CM | POA: Diagnosis not present

## 2021-12-26 DIAGNOSIS — E6609 Other obesity due to excess calories: Secondary | ICD-10-CM | POA: Diagnosis not present

## 2022-04-30 DIAGNOSIS — H43393 Other vitreous opacities, bilateral: Secondary | ICD-10-CM | POA: Diagnosis not present

## 2022-04-30 DIAGNOSIS — E113393 Type 2 diabetes mellitus with moderate nonproliferative diabetic retinopathy without macular edema, bilateral: Secondary | ICD-10-CM | POA: Diagnosis not present

## 2022-04-30 DIAGNOSIS — H43813 Vitreous degeneration, bilateral: Secondary | ICD-10-CM | POA: Diagnosis not present

## 2022-04-30 DIAGNOSIS — H35033 Hypertensive retinopathy, bilateral: Secondary | ICD-10-CM | POA: Diagnosis not present

## 2022-05-21 ENCOUNTER — Other Ambulatory Visit (HOSPITAL_COMMUNITY): Payer: Self-pay | Admitting: Family Medicine

## 2022-05-21 DIAGNOSIS — Z1231 Encounter for screening mammogram for malignant neoplasm of breast: Secondary | ICD-10-CM

## 2022-06-03 ENCOUNTER — Ambulatory Visit (HOSPITAL_COMMUNITY)
Admission: RE | Admit: 2022-06-03 | Discharge: 2022-06-03 | Disposition: A | Discharge status: Home / Self Care | Payer: Medicare Other | Source: Ambulatory Visit | Attending: Family Medicine | Admitting: Family Medicine

## 2022-06-03 DIAGNOSIS — Z1231 Encounter for screening mammogram for malignant neoplasm of breast: Secondary | ICD-10-CM | POA: Insufficient documentation

## 2022-06-13 ENCOUNTER — Ambulatory Visit
Admission: EM | Admit: 2022-06-13 | Discharge: 2022-06-13 | Disposition: A | Discharge status: Home / Self Care | Payer: Medicare Other | Attending: Nurse Practitioner | Admitting: Nurse Practitioner

## 2022-06-13 DIAGNOSIS — W57XXXA Bitten or stung by nonvenomous insect and other nonvenomous arthropods, initial encounter: Secondary | ICD-10-CM

## 2022-06-13 DIAGNOSIS — R21 Rash and other nonspecific skin eruption: Secondary | ICD-10-CM | POA: Diagnosis not present

## 2022-06-13 MED ORDER — TRIAMCINOLONE ACETONIDE 0.1 % EX CREA: 1.0000 | TOPICAL_CREAM | Freq: Two times a day (BID) | CUTANEOUS | 0 refills | Status: DC

## 2022-06-13 NOTE — ED Triage Notes (Signed)
Pt reports insect bite and rash in abdomen, legs and arms x 1 day. Reports started after sh became from the beach yesterday.

## 2022-06-13 NOTE — Discharge Instructions (Signed)
Apply medication as prescribed. May take over-the-counter Benadryl as needed for itching. Take 1-2 tablets at bedtime. Medication may cause drowsiness. Avoid scratching or manipulating the areas while symptoms persist. Avoid hot baths or showers while symptoms persist. Follow-up if symptoms fail to improve.

## 2022-06-13 NOTE — ED Provider Notes (Addendum)
[facility]    CSN: [id number] Arrival date & time: 06/13/22  1210      History   Chief Complaint Chief Complaint  Patient presents with   Insect Bite    HPI [name] is a 71 y.o. female.   The history is provided by the patient.   Presents for complaints of insect bites that occurred 1 day ago.  Patient states she and her brother were driving back from the beach, and after she got home, she noticed insect bites to her generalized body.  She states the areas are itchy.  She states "they are everywhere".  She denies fever, chills, exposure to new medications, soaps, foods, detergents, or lotions.  States she has been using an anti-itch cream and applied clear nail polish to the areas as she thought they were chiggers.  Patient would like to be sure they are not bedbugs.  Past Medical History:  Diagnosis Date   Aortic atherosclerosis ([facility]) 05/2018   Aortic valve sclerosis 01/14/2015   Moderate without stenosis, noted on ECHO   Diabetes mellitus    Diverticulosis 12/18/2016   Mild, noted on colonoscopy   Endometrial cancer ([facility])    Metastatic, Stage IB grade 1, recurrent   Grade I diastolic dysfunction 01/14/2015   noted on ECHO   Heart murmur    History of cardiomegaly 2003   History of colon polyps    Hypertension    Internal hemorrhoids 12/18/2016   noted on colonoscopy   LAE (left atrial enlargement) 01/14/2015   Mild, noted on ECHO   Lung cancer ([facility])    LVH (left ventricular hypertrophy) 01/24/2015   Mild, noted on ECHO   Mass of right lung    Obesity    Pneumothorax on right 2005   History of    PONV (postoperative nausea and vomiting)    Pulmonary nodules    Bilateral   RBBB (right bundle branch block)    Renal cyst, left    pt unaware   Spondylosis    Mild, lumbar   Wears contact lenses    Wears glasses    Wears partial dentures    upper and lower    Patient Active Problem List   Diagnosis Date Noted   Secondary malignant  neoplasm of genital organs ([facility]) 11/22/2018   Special screening for malignant neoplasms, colon    Obesity 12/21/2014   Endometrial cancer ([facility]) 10/20/2013    Past Surgical History:  Procedure Laterality Date   ABDOMINAL HYSTERECTOMY  04/2002   TAH   BILATERAL OOPHORECTOMY  1998   BREAST SURGERY     reduction   CESAREAN SECTION     x2   CHOLECYSTECTOMY     COLONOSCOPY N/A 12/18/2016   Procedure: COLONOSCOPY;  Surgeon: West Bali, MD;  Location[facility];  Service: Endoscopy;  Laterality: N/A;  9:45 AM   REDUCTION MAMMAPLASTY Bilateral    thoracic lung biopsy Left    TONSILLECTOMY      OB History   No obstetric history on file.      Home Medications    Prior to Admission medications   Medication Sig Start Date End Date Taking? Authorizing Provider  triamcinolone cream (KENALOG) 0.1 % Apply 1 Application topically 2 (two) times daily. 06/13/22  Yes Leath-Warren, Sadie Haber, NP  aspirin EC 81 MG tablet Take 81 mg by mouth daily.    [provider]  Calcium Carb-Cholecalciferol (CALCIUM 500+D PO) Take 1 tablet by mouth daily.  [provider]  cholecalciferol (VITAMIN D) 1000 UNITS tablet Take 1,000 Units by mouth daily.    [provider]  CINNAMON PO Take 1,000 mg by mouth daily.     [provider]  glipiZIDE (GLUCOTROL XL) 10 MG 24 hr tablet Take 10 mg by mouth daily.     [provider]  lisinopril (PRINIVIL,ZESTRIL) 20 MG tablet Take 20 mg by mouth daily.    [provider]  metFORMIN (GLUCOPHAGE) 500 MG tablet Take 1 tablet by mouth 2 (two) times daily. 09/22/21   [provider]  metoprolol succinate (TOPROL-XL) 25 MG 24 hr tablet Take 25 mg by mouth daily.    [provider]  Multiple Vitamin (MULTI VITAMIN DAILY PO) Take 1 each by mouth daily.    [provider]  Omega-3 Fatty Acids (FISH OIL) 1000 MG CAPS Take 1 each by mouth daily.     [provider]  simvastatin  (ZOCOR) 20 MG tablet Take 20 mg by mouth every evening.     [provider]  Turmeric 500 MG CAPS Take 1 capsule by mouth daily.     [provider]  vitamin C (ASCORBIC ACID) 500 MG tablet Take 500 mg by mouth daily.    [provider]    Family History Family History  Problem Relation Age of Onset   Ovarian cancer Mother    Heart Problems Father    Coronary artery disease Brother     Social History Social History   Tobacco Use   Smoking status: Never   Smokeless tobacco: Never  Vaping Use   Vaping Use: Never used  Substance Use Topics   Alcohol use: No   Drug use: No     Allergies   Compazine   Review of Systems Review of Systems Per HPI  Physical Exam Triage Vital Signs ED Triage Vitals [06/13/22 1355]  Enc Vitals Group     BP (!) 190/79     Pulse Rate 64     Resp 18     Temp 97.7 F (36.5 C)     Temp Source Oral     SpO2 95 %     Weight      Height      Head Circumference      Peak Flow      Pain Score 0     Pain Loc      Pain Edu?      Excl. in GC?    No data found.  Updated Vital Signs BP (!) 190/79 (BP Location: Right Arm)   Pulse 64   Temp 97.7 F (36.5 C) (Oral)   Resp 18   SpO2 95%   Visual Acuity Right Eye Distance:   Left Eye Distance:   Bilateral Distance:    Right Eye Near:   Left Eye Near:    Bilateral Near:     Physical Exam Vitals and nursing note reviewed.  Constitutional:      General: She is not in acute distress.    Appearance: Normal appearance.  HENT:     Head: Normocephalic.  Eyes:     Extraocular Movements: Extraocular movements intact.     Pupils: Pupils are equal, round, and reactive to light.  Cardiovascular:     Rate and Rhythm: Normal rate and regular rhythm.     Pulses: Normal pulses.     Heart sounds: Normal heart sounds.  Pulmonary:     Effort: Pulmonary effort is normal.  Breath sounds: Normal breath sounds.  Abdominal:     General: Bowel sounds are normal.      Palpations: Abdomen is soft.  Musculoskeletal:     Cervical back: Normal range of motion.  Skin:    General: Skin is warm and dry.     Comments: Multiple indurations to the bilateral upper extremities, lower extremities and mid neck.  Areas are annular, erythematous, firm, and raised with a central punctum.  There is no oozing, fluctuance, or drainage present.  Neurological:     General: No focal deficit present.     Mental Status: She is alert and oriented to person, place, and time.  Psychiatric:        Mood and Affect: Mood normal.        Behavior: Behavior normal.      UC Treatments / Results  Labs (all labs ordered are listed, but only abnormal results are displayed) Labs Reviewed - No data to display  EKG   Radiology No results found.  Procedures Procedures (including critical care time)  Medications Ordered in UC Medications - No data to display  Initial Impression / Assessment and Plan / UC Course  I have reviewed the triage vital signs and the nursing notes.  Pertinent labs & imaging results that were available during my care of the patient were reviewed by me and considered in my medical decision making (see chart for details).  Presents for complaints of insect bites that she noticed 1 day ago after she returned home from the beach.  On exam, patient has multiple indurations to the generalized body area that are annular, erythematous, firm, and raised with a central punctum.  Symptoms appear to be consistent with an insect bite, most likely a mosquito.  Patient was given triamcinolone cream to apply to the areas.  Patient was also given supportive care recommendations to include the use of over-the-counter Benadryl.  Patient was advised to follow-up if symptoms fail to improve.  Patient verbalizes understanding.  All questions were answered. Final Clinical Impressions(s) / UC Diagnoses   Final diagnoses:  Rash and nonspecific skin eruption     Discharge  Instructions      Apply medication as prescribed. May take over-the-counter Benadryl as needed for itching. Take 1-2 tablets at bedtime. Medication may cause drowsiness. Avoid scratching or manipulating the areas while symptoms persist. Avoid hot baths or showers while symptoms persist. Follow-up if symptoms fail to improve.      ED Prescriptions     Medication Sig Dispense Auth. Provider   triamcinolone cream (KENALOG) 0.1 % Apply 1 Application topically 2 (two) times daily. 45 g Leath-Warren, Sadie Haber, NP      PDMP not reviewed this encounter.   Abran Cantor, NP 06/13/22 1442    Abran Cantor, NP 06/17/22 [phone]

## 2022-07-23 DIAGNOSIS — Z23 Encounter for immunization: Secondary | ICD-10-CM | POA: Diagnosis not present

## 2022-07-23 DIAGNOSIS — Z6833 Body mass index (BMI) 33.0-33.9, adult: Secondary | ICD-10-CM | POA: Diagnosis not present

## 2022-07-23 DIAGNOSIS — I10 Essential (primary) hypertension: Secondary | ICD-10-CM | POA: Diagnosis not present

## 2022-07-23 DIAGNOSIS — E6609 Other obesity due to excess calories: Secondary | ICD-10-CM | POA: Diagnosis not present

## 2022-07-23 DIAGNOSIS — E782 Mixed hyperlipidemia: Secondary | ICD-10-CM | POA: Diagnosis not present

## 2022-07-23 DIAGNOSIS — E119 Type 2 diabetes mellitus without complications: Secondary | ICD-10-CM | POA: Diagnosis not present

## 2022-07-23 DIAGNOSIS — E7849 Other hyperlipidemia: Secondary | ICD-10-CM | POA: Diagnosis not present

## 2022-09-10 IMAGING — US US CAROTID DUPLEX BILAT
1 series · 13 of 24 positions shown · non-contrast
Comparison: None.

CLINICAL DATA: Bruits

EXAM:
BILATERAL CAROTID DUPLEX ULTRASOUND
TECHNIQUE: Gray scale imaging, color Doppler and duplex ultrasound were
performed of bilateral carotid and vertebral arteries in the neck.

[Series 1: us carotid bilateral · 13 of 68 slices shown]
[im 1/68]
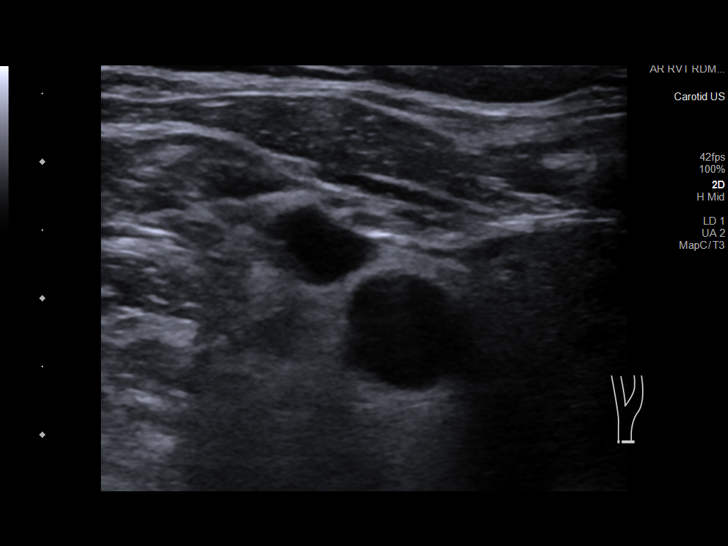
[im 6/68]
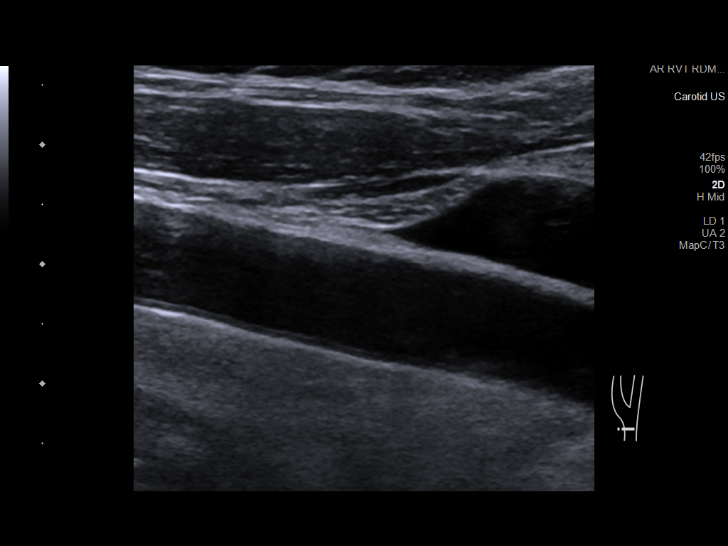
[im 12/68]
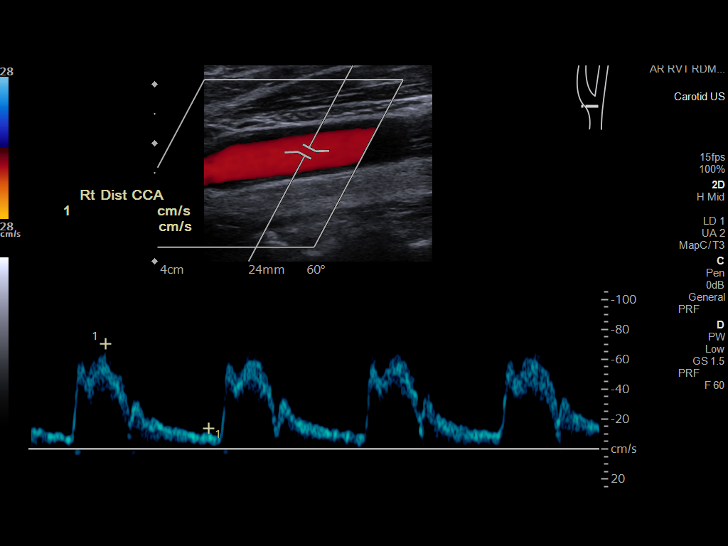
[im 18/68]
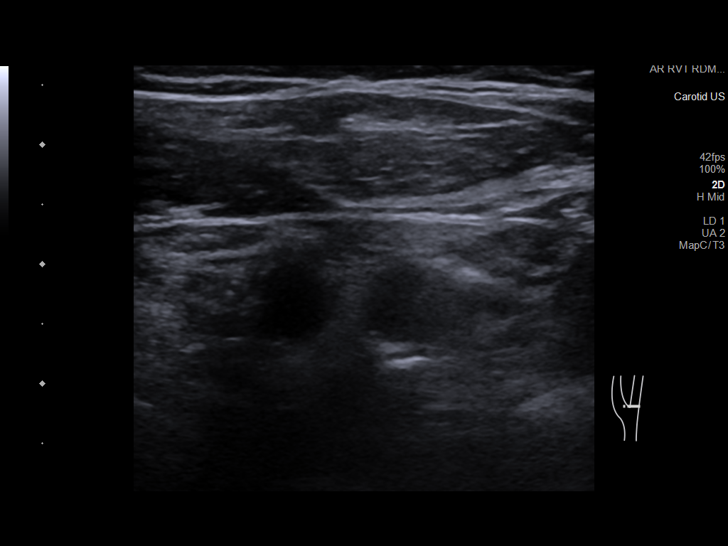
[im 24/68]
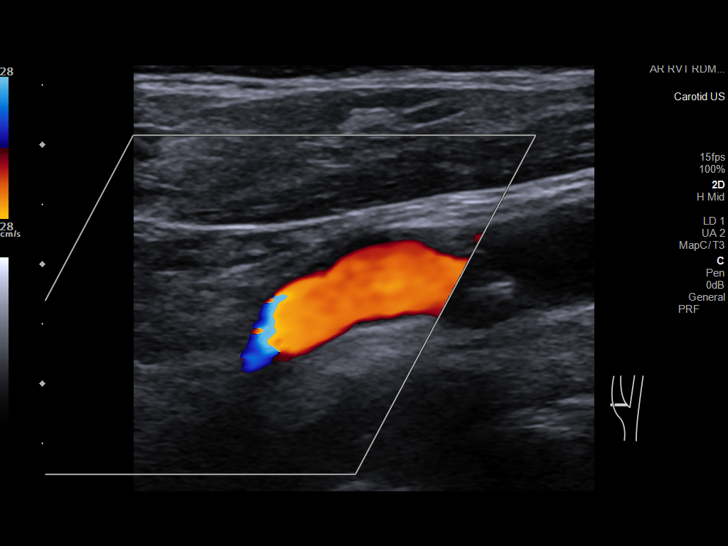
[im 30/68]
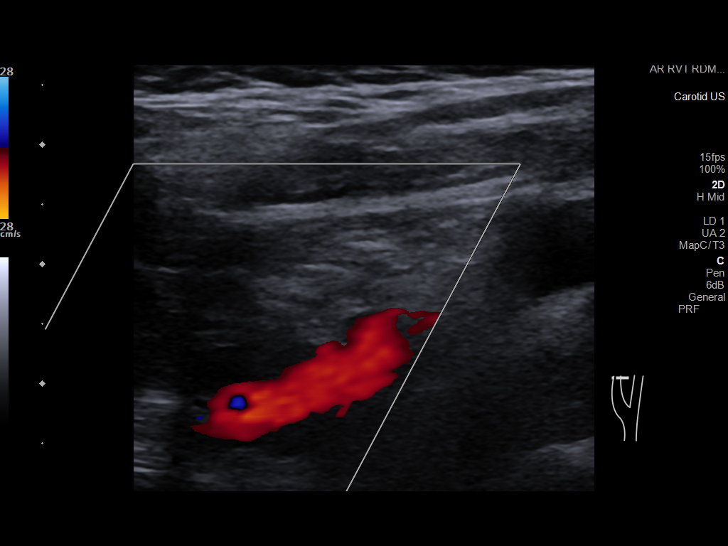
[im 35/68]
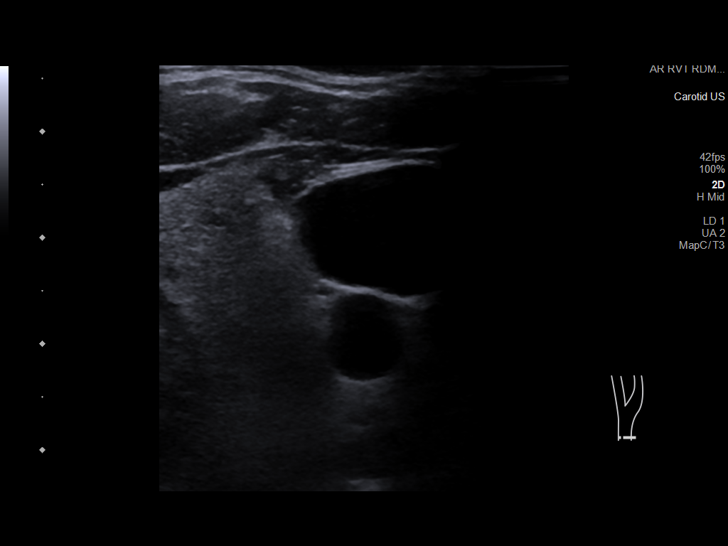
[im 38/68]
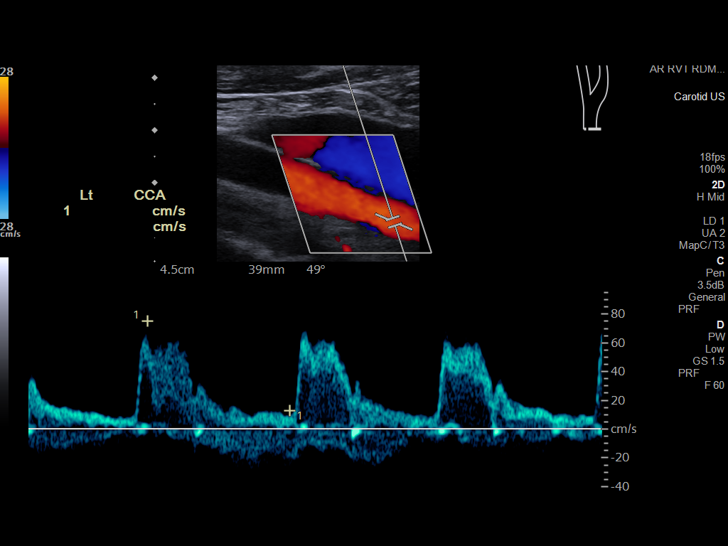
[im 44/68]
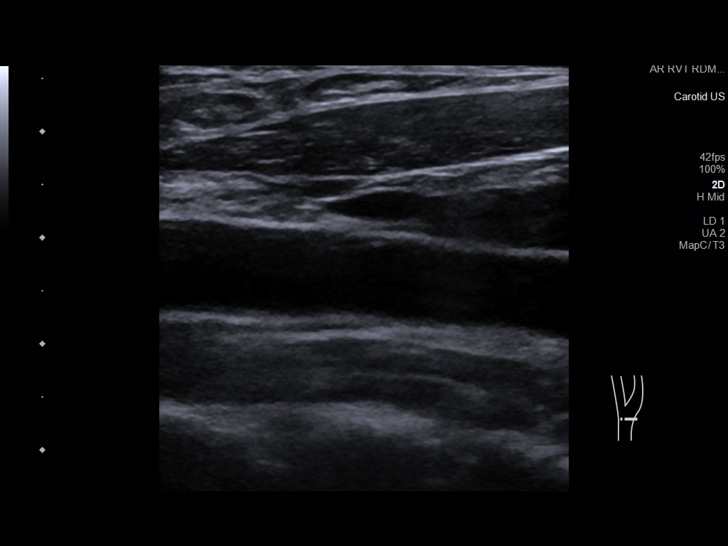
[im 50/68]
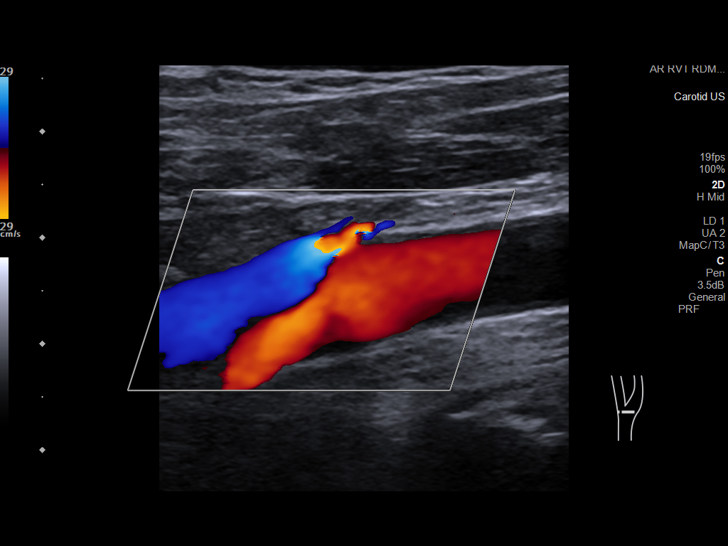
[im 56/68]
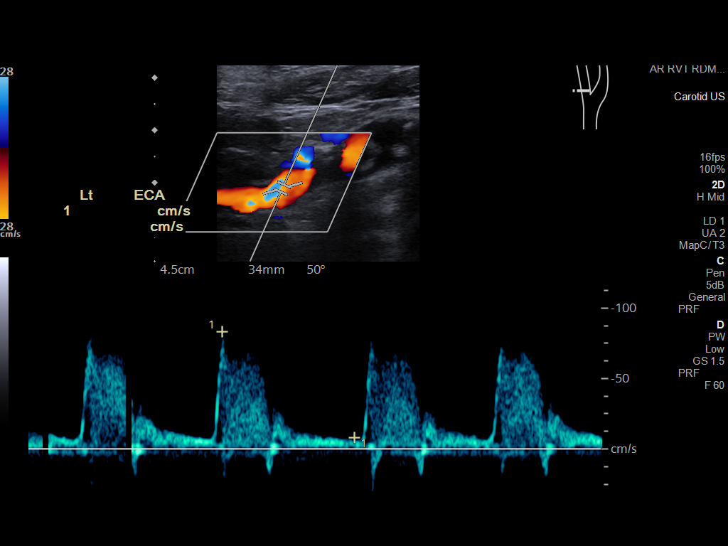
[im 62/68]
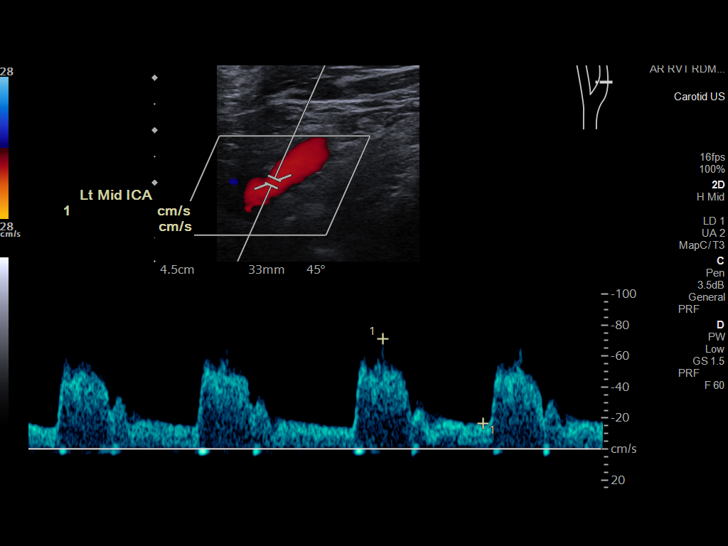
[im 68/68]
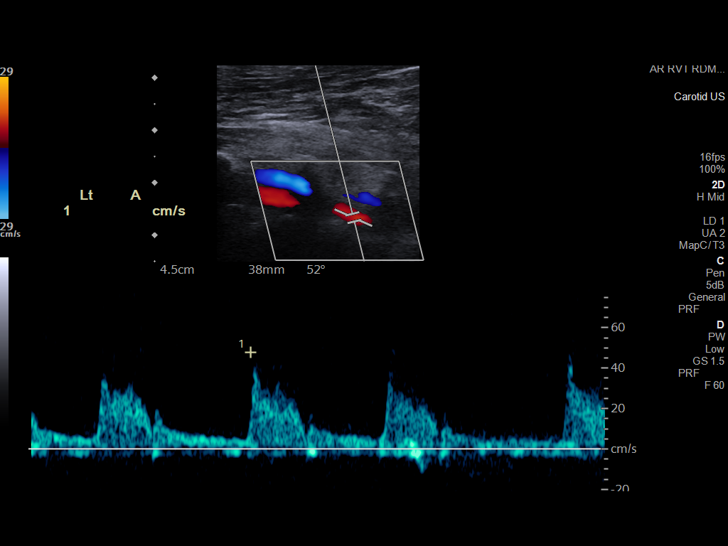

[13 of 24 positions shown; findings below may reference images not displayed]

FINDINGS: Criteria: Quantification of carotid stenosis is based on velocity
parameters that correlate the residual internal carotid diameter
with NASCET-based stenosis levels, using the diameter of the distal
internal carotid lumen as the denominator for stenosis measurement.

The following velocity measurements were obtained:

RIGHT

ICA: 75/19 cm/sec

CCA: 67/9 cm/sec

SYSTOLIC ICA/CCA RATIO:

ECA: 67 cm/sec

LEFT

ICA: 92/16 cm/sec

CCA: 68/11 cm/sec

SYSTOLIC ICA/CCA RATIO:

ECA: 83 cm/sec

RIGHT CAROTID ARTERY: Small scattered atherosclerotic plaque in the
distal common carotid artery and carotid bulb results in less than
50% stenosis by Doppler criteria. Normal low resistance waveforms in
the internal carotid artery.

RIGHT VERTEBRAL ARTERY:  Antegrade flow

LEFT CAROTID ARTERY: Minimal plaque in the left carotid bulb results
in less than 50% stenosis by Doppler criteria. Normal low resistance
waveforms in the internal carotid artery.

LEFT VERTEBRAL ARTERY:  Antegrade flow
IMPRESSION: No evidence of hemodynamically significant stenosis involving either
the right or left carotid circulation in the neck by Doppler
criteria. Right-greater-than-left mild atherosclerotic plaque within
the carotid bulbs results in less than 50% stenosis by Doppler
criteria.

Bilateral vertebral arteries demonstrate normal antegrade flow.

## 2022-09-24 DIAGNOSIS — Z1331 Encounter for screening for depression: Secondary | ICD-10-CM | POA: Diagnosis not present

## 2022-09-24 DIAGNOSIS — Z0001 Encounter for general adult medical examination with abnormal findings: Secondary | ICD-10-CM | POA: Diagnosis not present

## 2022-09-24 DIAGNOSIS — Z853 Personal history of malignant neoplasm of breast: Secondary | ICD-10-CM | POA: Diagnosis not present

## 2022-09-24 DIAGNOSIS — E6609 Other obesity due to excess calories: Secondary | ICD-10-CM | POA: Diagnosis not present

## 2022-09-24 DIAGNOSIS — E1129 Type 2 diabetes mellitus with other diabetic kidney complication: Secondary | ICD-10-CM | POA: Diagnosis not present

## 2022-09-24 DIAGNOSIS — R32 Unspecified urinary incontinence: Secondary | ICD-10-CM | POA: Diagnosis not present

## 2022-09-24 DIAGNOSIS — I6521 Occlusion and stenosis of right carotid artery: Secondary | ICD-10-CM | POA: Diagnosis not present

## 2022-09-24 DIAGNOSIS — I6522 Occlusion and stenosis of left carotid artery: Secondary | ICD-10-CM | POA: Diagnosis not present

## 2022-09-24 DIAGNOSIS — E782 Mixed hyperlipidemia: Secondary | ICD-10-CM | POA: Diagnosis not present

## 2022-09-24 DIAGNOSIS — I10 Essential (primary) hypertension: Secondary | ICD-10-CM | POA: Diagnosis not present

## 2022-09-24 DIAGNOSIS — Z6834 Body mass index (BMI) 34.0-34.9, adult: Secondary | ICD-10-CM | POA: Diagnosis not present

## 2022-10-29 DIAGNOSIS — H35033 Hypertensive retinopathy, bilateral: Secondary | ICD-10-CM | POA: Diagnosis not present

## 2022-10-29 DIAGNOSIS — E113393 Type 2 diabetes mellitus with moderate nonproliferative diabetic retinopathy without macular edema, bilateral: Secondary | ICD-10-CM | POA: Diagnosis not present

## 2022-10-29 DIAGNOSIS — H43393 Other vitreous opacities, bilateral: Secondary | ICD-10-CM | POA: Diagnosis not present

## 2022-10-29 DIAGNOSIS — H43813 Vitreous degeneration, bilateral: Secondary | ICD-10-CM | POA: Diagnosis not present

## 2022-12-31 ENCOUNTER — Other Ambulatory Visit (HOSPITAL_COMMUNITY): Payer: Self-pay | Admitting: Family Medicine

## 2022-12-31 DIAGNOSIS — E6609 Other obesity due to excess calories: Secondary | ICD-10-CM | POA: Diagnosis not present

## 2022-12-31 DIAGNOSIS — Z6834 Body mass index (BMI) 34.0-34.9, adult: Secondary | ICD-10-CM | POA: Diagnosis not present

## 2022-12-31 DIAGNOSIS — I6521 Occlusion and stenosis of right carotid artery: Secondary | ICD-10-CM

## 2022-12-31 DIAGNOSIS — I6522 Occlusion and stenosis of left carotid artery: Secondary | ICD-10-CM

## 2022-12-31 DIAGNOSIS — E1129 Type 2 diabetes mellitus with other diabetic kidney complication: Secondary | ICD-10-CM | POA: Diagnosis not present

## 2022-12-31 DIAGNOSIS — E785 Hyperlipidemia, unspecified: Secondary | ICD-10-CM | POA: Diagnosis not present

## 2023-03-01 DIAGNOSIS — E875 Hyperkalemia: Secondary | ICD-10-CM | POA: Diagnosis not present

## 2023-03-01 DIAGNOSIS — N281 Cyst of kidney, acquired: Secondary | ICD-10-CM | POA: Diagnosis not present

## 2023-03-01 DIAGNOSIS — I129 Hypertensive chronic kidney disease with stage 1 through stage 4 chronic kidney disease, or unspecified chronic kidney disease: Secondary | ICD-10-CM | POA: Diagnosis not present

## 2023-03-01 DIAGNOSIS — N189 Chronic kidney disease, unspecified: Secondary | ICD-10-CM | POA: Diagnosis not present

## 2023-03-01 DIAGNOSIS — N1832 Chronic kidney disease, stage 3b: Secondary | ICD-10-CM | POA: Diagnosis not present

## 2023-03-01 DIAGNOSIS — E1122 Type 2 diabetes mellitus with diabetic chronic kidney disease: Secondary | ICD-10-CM | POA: Diagnosis not present

## 2023-03-30 DIAGNOSIS — H4311 Vitreous hemorrhage, right eye: Secondary | ICD-10-CM | POA: Diagnosis not present

## 2023-03-30 DIAGNOSIS — H43811 Vitreous degeneration, right eye: Secondary | ICD-10-CM | POA: Diagnosis not present

## 2023-03-30 DIAGNOSIS — E113412 Type 2 diabetes mellitus with severe nonproliferative diabetic retinopathy with macular edema, left eye: Secondary | ICD-10-CM | POA: Diagnosis not present

## 2023-04-07 ENCOUNTER — Emergency Department (HOSPITAL_COMMUNITY)
Admission: EM | Admit: 2023-04-07 | Discharge: 2023-04-07 | Disposition: A | Discharge status: Home / Self Care | Payer: Medicare Other | Attending: Emergency Medicine | Admitting: Emergency Medicine

## 2023-04-07 ENCOUNTER — Other Ambulatory Visit: Payer: Self-pay

## 2023-04-07 ENCOUNTER — Encounter (HOSPITAL_COMMUNITY): Payer: Self-pay

## 2023-04-07 DIAGNOSIS — Z7982 Long term (current) use of aspirin: Secondary | ICD-10-CM | POA: Insufficient documentation

## 2023-04-07 DIAGNOSIS — I10 Essential (primary) hypertension: Secondary | ICD-10-CM | POA: Diagnosis not present

## 2023-04-07 DIAGNOSIS — R001 Bradycardia, unspecified: Secondary | ICD-10-CM | POA: Diagnosis not present

## 2023-04-07 DIAGNOSIS — Z79899 Other long term (current) drug therapy: Secondary | ICD-10-CM | POA: Diagnosis not present

## 2023-04-07 LAB — URINALYSIS, ROUTINE W REFLEX MICROSCOPIC
Bacteria, UA: NONE SEEN
Bilirubin Urine: NEGATIVE
Glucose, UA: NEGATIVE mg/dL
Hgb urine dipstick: NEGATIVE
Ketones, ur: NEGATIVE mg/dL
Nitrite: NEGATIVE
Protein, ur: >=300 mg/dL — AB
Specific Gravity, Urine: 1.007 (ref 1.005–1.030)
pH: 5 (ref 5.0–8.0)

## 2023-04-07 LAB — BASIC METABOLIC PANEL
Anion gap: 12 (ref 5–15)
BUN: 21 mg/dL (ref 8–23)
CO2: 22 mmol/L (ref 22–32)
Calcium: 9.4 mg/dL (ref 8.9–10.3)
Chloride: 106 mmol/L (ref 98–111)
Creatinine, Ser: 1.36 mg/dL — ABNORMAL HIGH (ref 0.44–1.00)
GFR, Estimated: 42 mL/min — ABNORMAL LOW (ref >60)
Glucose, Bld: 86 mg/dL (ref 70–99)
Potassium: 3.7 mmol/L (ref 3.5–5.1)
Sodium: 140 mmol/L (ref 135–145)

## 2023-04-07 LAB — CBC WITH DIFFERENTIAL/PLATELET
Abs Immature Granulocytes: 0.12 10*3/uL — ABNORMAL HIGH (ref 0.00–0.07)
Basophils Absolute: 0.1 10*3/uL (ref 0.0–0.1)
Basophils Relative: 1 %
Eosinophils Absolute: 0.1 10*3/uL (ref 0.0–0.5)
Eosinophils Relative: 2 %
HCT: 38 % (ref 36.0–46.0)
Hemoglobin: 13 g/dL (ref 12.0–15.0)
Immature Granulocytes: 1 %
Lymphocytes Relative: 23 %
Lymphs Abs: 2 10*3/uL (ref 0.7–4.0)
MCH: 32.8 pg (ref 26.0–34.0)
MCHC: 34.2 g/dL (ref 30.0–36.0)
MCV: 96 fL (ref 80.0–100.0)
Monocytes Absolute: 0.5 10*3/uL (ref 0.1–1.0)
Monocytes Relative: 5 %
Neutro Abs: 6 10*3/uL (ref 1.7–7.7)
Neutrophils Relative %: 68 %
Platelets: 194 10*3/uL (ref 150–400)
RBC: 3.96 MIL/uL (ref 3.87–5.11)
RDW: 12.8 % (ref 11.5–15.5)
WBC: 8.8 10*3/uL (ref 4.0–10.5)
nRBC: 0 % (ref 0.0–0.2)

## 2023-04-07 MED ORDER — CLONIDINE HCL 0.1 MG PO TABS
0.1000 mg | ORAL_TABLET | Freq: Once | ORAL | Status: AC
  Administered 2023-04-07: 0.1 mg via ORAL
  Filled 2023-04-07: qty 1

## 2023-04-07 NOTE — ED Provider Notes (Signed)
[place] [facility]ERGENCY DEPARTMENT AT [facility] Provider Note   CSN: [id number] Arrival date & time: 04/07/23  1537     History  Chief Complaint  Patient presents with   Hypertension    [name] is a 72 y.o. female.  History of hypertension, presents the ER today for high blood pressure readings at home, woke up and checked her blood pressure today as usual and had systolic of 210 which is the highest its ever been, she has been having high blood pressure since May has been recording it and following up with nephrology for this.  They have been adjusting her medications, she states her blood pressure always runs high but is worried today because is higher than usual.  Denies any chest pain, shortness of breath, numbness tingling or weakness, no nausea or vomiting or headache. She tried to call her nephrologist but did not get a phone call back so decided to come to the ED.  She checked her blood pressure multiple times with readings in the 180s up to 200s systolic.  She has been compliant with her 40 of lisinopril at night and 25 of metoprolol in the daytime.   Hypertension       Home Medications Prior to Admission medications   Medication Sig Start Date End Date Taking? Authorizing Provider  aspirin EC 81 MG tablet Take 81 mg by mouth daily.    [provider]  Calcium Carb-Cholecalciferol (CALCIUM 500+D PO) Take 1 tablet by mouth daily.    [provider]  cholecalciferol (VITAMIN D) 1000 UNITS tablet Take 1,000 Units by mouth daily.    [provider]  CINNAMON PO Take 1,000 mg by mouth daily.     [provider]  glipiZIDE (GLUCOTROL XL) 10 MG 24 hr tablet Take 10 mg by mouth daily.     [provider]  lisinopril (PRINIVIL,ZESTRIL) 20 MG tablet Take 20 mg by mouth daily.    [provider]  metFORMIN (GLUCOPHAGE) 500 MG tablet Take 1 tablet by mouth 2 (two) times daily. 09/22/21   [provider]   metoprolol succinate (TOPROL-XL) 25 MG 24 hr tablet Take 25 mg by mouth daily.    [provider]  Multiple Vitamin (MULTI VITAMIN DAILY PO) Take 1 each by mouth daily.    [provider]  Omega-3 Fatty Acids (FISH OIL) 1000 MG CAPS Take 1 each by mouth daily.     [provider]  simvastatin (ZOCOR) 20 MG tablet Take 20 mg by mouth every evening.     [provider]  triamcinolone cream (KENALOG) 0.1 % Apply 1 Application topically 2 (two) times daily. 06/13/22   Leath-Warren, Sadie Haber, NP  Turmeric 500 MG CAPS Take 1 capsule by mouth daily.     [provider]  vitamin C (ASCORBIC ACID) 500 MG tablet Take 500 mg by mouth daily.    [provider]      Allergies    Compazine    Review of Systems   Review of Systems  Physical Exam Updated Vital Signs BP (!) 176/76   Pulse (!) 57   Temp 98.5 F (36.9 C) (Oral)   Resp 18   Ht 5\' 4"  (1.626 m)   Wt 86.5 kg   SpO2 98%   BMI 32.73 kg/m  Physical Exam Vitals and nursing note reviewed.  Constitutional:      General: She is not in acute distress.    Appearance: She is well-developed.  HENT:     Head: Normocephalic and atraumatic.  Eyes:     Conjunctiva/sclera: Conjunctivae normal.  Cardiovascular:     Rate and Rhythm: Normal rate and regular rhythm.     Heart sounds: No murmur heard. Pulmonary:     Effort: Pulmonary effort is normal. No respiratory distress.     Breath sounds: Normal breath sounds.  Abdominal:     Palpations: Abdomen is soft.     Tenderness: There is no abdominal tenderness.  Musculoskeletal:        General: No swelling.     Cervical back: Neck supple.  Skin:    General: Skin is warm and dry.     Capillary Refill: Capillary refill takes less than 2 seconds.  Neurological:     Mental Status: She is alert.  Psychiatric:        Mood and Affect: Mood normal.     ED Results / Procedures / Treatments   Labs (all labs ordered are listed, but only  abnormal results are displayed) Labs Reviewed  BASIC METABOLIC PANEL - Abnormal; Notable for the following components:      Result Value   Creatinine, Ser 1.36 (*)    GFR, Estimated 42 (*)    All other components within normal limits  CBC WITH DIFFERENTIAL/PLATELET - Abnormal; Notable for the following components:   Abs Immature Granulocytes 0.12 (*)    All other components within normal limits  URINALYSIS, ROUTINE W REFLEX MICROSCOPIC - Abnormal; Notable for the following components:   Color, Urine STRAW (*)    APPearance HAZY (*)    Protein, ur >=300 (*)    Leukocytes,Ua SMALL (*)    All other components within normal limits    EKG EKG Interpretation  Date/Time:  Wednesday April 07 2023 16:59:42 EDT Ventricular Rate:  59 PR Interval:  186 QRS Duration: 124 QT Interval:  438 QTC Calculation: 433 R Axis:   -71 Text Interpretation: Sinus bradycardia Right bundle branch block Left anterior fascicular block Possible Lateral infarct , age undetermined Inferior infarct , age undetermined Abnormal ECG When compared with ECG of 24-Mar-2019 10:07, rate is slower than prior Confirmed by Meridee Score [phone]) on 04/07/2023 5:01:51 PM  Radiology No results found.  Procedures Procedures    Medications Ordered in ED Medications  cloNIDine (CATAPRES) tablet 0.1 mg (0.1 mg Oral Given 04/07/23 1908)    ED Course/ Medical Decision Making/ A&P                             Medical Decision Making Pt noted to have very high blood pressure today. They have no symptoms, including no chest pain, SOB, vision change, numbness/tingling/weakness. They were made aware of the value and the need to follow up with primary care in 24-48 hours, and to return immediately if they develop any symptoms.  Labs today are reassuring, renal function is somewhat elevated from the last but that was several years ago, she is following with nephrology.  Can continue following with them.  Urinalysis was sent which  showed small leukocytes with 21-50 white blood cells but patient does not have dysuria and there is no bacteriuria.  She does have some proteinuria.  No need for antibiotics but can follow-up with nephrology regarding the proteinuria  Blood pressure came down somewhat in the ED, discussed with attending as well, plan was to give 1 dose of 0.1 of clonidine to help lower blood pressure today as patient  was very distressed at the high number have her follow-up with her primary care doctor on Friday.  Discussed this plan with patient as well, discussed return precautions.  Patient is requesting to go home now.  Amount and/or Complexity of Data Reviewed External Data Reviewed: labs and notes. Labs: ordered. Decision-making details documented in ED Course.  Risk Prescription drug management.           Final Clinical Impression(s) / ED Diagnoses Final diagnoses:  Hypertension, unspecified type    Rx / DC Orders ED Discharge Orders     None         Josem Kaufmann 04/07/23 1912    Terrilee Files, MD 04/08/23 [phone]

## 2023-04-07 NOTE — Discharge Instructions (Signed)
You are seen today for high blood pressure, it is improving, you are given a dose of Lodine today to help with your blood pressure.  Make sure you see your primary care doctor on Friday for follow-up and follow-up with your nephrologist.  Come back to the ER if you have new or worsening symptoms.

## 2023-04-07 NOTE — ED Triage Notes (Signed)
Pt comes in with complaints of HTN. Pt states that she goes to [facility] and after lab results was prescribed new BP medication. Pt takes 25 mg metoprolol in the morning and 40 mg lisinopril at night. Pt states taking her BP today it ready 210/107. Pt has tried calling [facility] with no returned phone calls so she came here. Pt is not symptomatic.

## 2023-04-08 DIAGNOSIS — E113312 Type 2 diabetes mellitus with moderate nonproliferative diabetic retinopathy with macular edema, left eye: Secondary | ICD-10-CM | POA: Diagnosis not present

## 2023-04-08 DIAGNOSIS — E113591 Type 2 diabetes mellitus with proliferative diabetic retinopathy without macular edema, right eye: Secondary | ICD-10-CM | POA: Diagnosis not present

## 2023-04-08 DIAGNOSIS — H43393 Other vitreous opacities, bilateral: Secondary | ICD-10-CM | POA: Diagnosis not present

## 2023-04-08 DIAGNOSIS — H43813 Vitreous degeneration, bilateral: Secondary | ICD-10-CM | POA: Diagnosis not present

## 2023-04-08 DIAGNOSIS — H35033 Hypertensive retinopathy, bilateral: Secondary | ICD-10-CM | POA: Diagnosis not present

## 2023-04-09 DIAGNOSIS — I6521 Occlusion and stenosis of right carotid artery: Secondary | ICD-10-CM | POA: Diagnosis not present

## 2023-04-09 DIAGNOSIS — E782 Mixed hyperlipidemia: Secondary | ICD-10-CM | POA: Diagnosis not present

## 2023-04-09 DIAGNOSIS — E6609 Other obesity due to excess calories: Secondary | ICD-10-CM | POA: Diagnosis not present

## 2023-04-09 DIAGNOSIS — E1129 Type 2 diabetes mellitus with other diabetic kidney complication: Secondary | ICD-10-CM | POA: Diagnosis not present

## 2023-04-09 DIAGNOSIS — I10 Essential (primary) hypertension: Secondary | ICD-10-CM | POA: Diagnosis not present

## 2023-04-09 DIAGNOSIS — I6522 Occlusion and stenosis of left carotid artery: Secondary | ICD-10-CM | POA: Diagnosis not present

## 2023-04-09 DIAGNOSIS — H4311 Vitreous hemorrhage, right eye: Secondary | ICD-10-CM | POA: Diagnosis not present

## 2023-04-09 DIAGNOSIS — Z6833 Body mass index (BMI) 33.0-33.9, adult: Secondary | ICD-10-CM | POA: Diagnosis not present

## 2023-04-14 ENCOUNTER — Telehealth: Payer: Self-pay

## 2023-04-14 NOTE — Telephone Encounter (Signed)
Transition Care Management Follow-up Telephone Call Date of discharge and from where: 04/07/2023 [facility] How have you been since you were released from the hospital? Patient is feeling better Any questions or concerns? No  Items Reviewed: Did the pt receive and understand the discharge instructions provided? Yes  Medications obtained and verified? Yes  Other? No  Any new allergies since your discharge? No  Dietary orders reviewed? Yes Do you have support at home? Yes   Follow up appointments reviewed:  PCP Hospital f/u appt confirmed? No  Scheduled to see  on  @ . Specialist Hospital f/u appt confirmed? No  Scheduled to see  on  @ . Are transportation arrangements needed? No  If their condition worsens, is the pt aware to call PCP or go to the Emergency Dept.? Yes Was the patient provided with contact information for the PCP's office or ED? Yes Was to pt encouraged to call back with questions or concerns? Yes  Tareq Dwan [facility]  [facility] Population Health Community Resource Care Guide   [email or web address]  ?? [phone]   Website: [email or web address]  [email or web address]

## 2023-04-29 DIAGNOSIS — I10 Essential (primary) hypertension: Secondary | ICD-10-CM | POA: Diagnosis not present

## 2023-04-29 DIAGNOSIS — E1129 Type 2 diabetes mellitus with other diabetic kidney complication: Secondary | ICD-10-CM | POA: Diagnosis not present

## 2023-04-29 DIAGNOSIS — E6609 Other obesity due to excess calories: Secondary | ICD-10-CM | POA: Diagnosis not present

## 2023-04-29 DIAGNOSIS — Z6833 Body mass index (BMI) 33.0-33.9, adult: Secondary | ICD-10-CM | POA: Diagnosis not present

## 2023-05-03 ENCOUNTER — Other Ambulatory Visit (HOSPITAL_COMMUNITY): Payer: Self-pay | Admitting: Family Medicine

## 2023-05-03 DIAGNOSIS — Z1231 Encounter for screening mammogram for malignant neoplasm of breast: Secondary | ICD-10-CM

## 2023-05-06 DIAGNOSIS — E113591 Type 2 diabetes mellitus with proliferative diabetic retinopathy without macular edema, right eye: Secondary | ICD-10-CM | POA: Diagnosis not present

## 2023-05-06 DIAGNOSIS — H43393 Other vitreous opacities, bilateral: Secondary | ICD-10-CM | POA: Diagnosis not present

## 2023-05-06 DIAGNOSIS — H43813 Vitreous degeneration, bilateral: Secondary | ICD-10-CM | POA: Diagnosis not present

## 2023-05-06 DIAGNOSIS — E113492 Type 2 diabetes mellitus with severe nonproliferative diabetic retinopathy without macular edema, left eye: Secondary | ICD-10-CM | POA: Diagnosis not present

## 2023-05-06 DIAGNOSIS — H35033 Hypertensive retinopathy, bilateral: Secondary | ICD-10-CM | POA: Diagnosis not present

## 2023-05-10 ENCOUNTER — Ambulatory Visit (HOSPITAL_COMMUNITY): Payer: Medicare Other

## 2023-05-10 ENCOUNTER — Other Ambulatory Visit (HOSPITAL_COMMUNITY): Payer: Self-pay | Admitting: Nephrology

## 2023-05-10 DIAGNOSIS — E1122 Type 2 diabetes mellitus with diabetic chronic kidney disease: Secondary | ICD-10-CM | POA: Diagnosis not present

## 2023-05-10 DIAGNOSIS — I129 Hypertensive chronic kidney disease with stage 1 through stage 4 chronic kidney disease, or unspecified chronic kidney disease: Secondary | ICD-10-CM | POA: Diagnosis not present

## 2023-05-10 DIAGNOSIS — N1832 Chronic kidney disease, stage 3b: Secondary | ICD-10-CM | POA: Diagnosis not present

## 2023-05-10 DIAGNOSIS — E875 Hyperkalemia: Secondary | ICD-10-CM | POA: Diagnosis not present

## 2023-05-10 DIAGNOSIS — N281 Cyst of kidney, acquired: Secondary | ICD-10-CM | POA: Diagnosis not present

## 2023-05-11 ENCOUNTER — Ambulatory Visit (HOSPITAL_COMMUNITY)
Admission: RE | Admit: 2023-05-11 | Discharge: 2023-05-11 | Disposition: A | Discharge status: Home / Self Care | Payer: Medicare Other | Source: Ambulatory Visit | Attending: Family Medicine | Admitting: Family Medicine

## 2023-05-11 ENCOUNTER — Ambulatory Visit (HOSPITAL_COMMUNITY)
Admission: RE | Admit: 2023-05-11 | Discharge: 2023-05-11 | Disposition: A | Discharge status: Home / Self Care | Payer: Medicare Other | Source: Ambulatory Visit | Attending: Nephrology | Admitting: Nephrology

## 2023-05-11 DIAGNOSIS — N1832 Chronic kidney disease, stage 3b: Secondary | ICD-10-CM | POA: Diagnosis not present

## 2023-05-11 DIAGNOSIS — I6522 Occlusion and stenosis of left carotid artery: Secondary | ICD-10-CM | POA: Diagnosis not present

## 2023-05-11 DIAGNOSIS — I6529 Occlusion and stenosis of unspecified carotid artery: Secondary | ICD-10-CM | POA: Diagnosis not present

## 2023-05-11 DIAGNOSIS — I6521 Occlusion and stenosis of right carotid artery: Secondary | ICD-10-CM | POA: Insufficient documentation

## 2023-05-11 DIAGNOSIS — R7989 Other specified abnormal findings of blood chemistry: Secondary | ICD-10-CM | POA: Diagnosis not present

## 2023-06-07 ENCOUNTER — Ambulatory Visit (HOSPITAL_COMMUNITY)
Admission: RE | Admit: 2023-06-07 | Discharge: 2023-06-07 | Disposition: A | Discharge status: Home / Self Care | Payer: Medicare Other | Source: Ambulatory Visit | Attending: Family Medicine | Admitting: Family Medicine

## 2023-06-07 DIAGNOSIS — Z1231 Encounter for screening mammogram for malignant neoplasm of breast: Secondary | ICD-10-CM | POA: Diagnosis not present

## 2023-06-17 DIAGNOSIS — E113591 Type 2 diabetes mellitus with proliferative diabetic retinopathy without macular edema, right eye: Secondary | ICD-10-CM | POA: Diagnosis not present

## 2023-08-17 DIAGNOSIS — Z23 Encounter for immunization: Secondary | ICD-10-CM | POA: Diagnosis not present

## 2023-09-06 DIAGNOSIS — E875 Hyperkalemia: Secondary | ICD-10-CM | POA: Diagnosis not present

## 2023-09-06 DIAGNOSIS — N281 Cyst of kidney, acquired: Secondary | ICD-10-CM | POA: Diagnosis not present

## 2023-09-06 DIAGNOSIS — N1832 Chronic kidney disease, stage 3b: Secondary | ICD-10-CM | POA: Diagnosis not present

## 2023-09-06 DIAGNOSIS — E1122 Type 2 diabetes mellitus with diabetic chronic kidney disease: Secondary | ICD-10-CM | POA: Diagnosis not present

## 2023-09-06 DIAGNOSIS — I129 Hypertensive chronic kidney disease with stage 1 through stage 4 chronic kidney disease, or unspecified chronic kidney disease: Secondary | ICD-10-CM | POA: Diagnosis not present

## 2023-09-14 ENCOUNTER — Encounter: Payer: Self-pay | Admitting: Nephrology

## 2023-09-28 ENCOUNTER — Ambulatory Visit
Admission: RE | Admit: 2023-09-28 | Discharge: 2023-09-28 | Disposition: A | Discharge status: Home / Self Care | Payer: Medicare Other | Source: Ambulatory Visit

## 2023-09-28 VITALS — BP 136/74 | HR 56 | Temp 98.3°F | Resp 20

## 2023-09-28 DIAGNOSIS — J209 Acute bronchitis, unspecified: Secondary | ICD-10-CM | POA: Diagnosis not present

## 2023-09-28 MED ORDER — PREDNISONE 20 MG PO TABS: 20.0000 mg | ORAL_TABLET | Freq: Every day | ORAL | 0 refills | Status: AC

## 2023-09-28 MED ORDER — METHYLPREDNISOLONE SODIUM SUCC 125 MG IJ SOLR
60.0000 mg | Freq: Once | INTRAMUSCULAR | Status: AC
  Administered 2023-09-28: 60 mg via INTRAMUSCULAR

## 2023-09-28 MED ORDER — GUAIFENESIN 100 MG/5ML PO LIQD: 10.0000 mL | Freq: Four times a day (QID) | ORAL | 0 refills | Status: DC | PRN

## 2023-09-28 MED ORDER — ALBUTEROL SULFATE HFA 108 (90 BASE) MCG/ACT IN AERS: 2.0000 | INHALATION_SPRAY | Freq: Four times a day (QID) | RESPIRATORY_TRACT | 0 refills | Status: AC | PRN

## 2023-09-28 MED ORDER — ALBUTEROL SULFATE (2.5 MG/3ML) 0.083% IN NEBU
2.5000 mg | INHALATION_SOLUTION | Freq: Once | RESPIRATORY_TRACT | Status: AC
  Administered 2023-09-28: 2.5 mg via RESPIRATORY_TRACT

## 2023-09-28 NOTE — Discharge Instructions (Signed)
You were given an injection of Solu-Medrol 60 mg today.  Do not start the prednisone until 09/29/2023. Take medication as prescribed. Increase fluids and allow for plenty of rest. Recommend using a humidifier at nighttime during sleep and sleeping slightly elevated on pillows while cough symptoms persist. Continue to monitor your symptoms are worsening.  If you develop new symptoms such as fever, chills, or worsening wheezing or shortness of breath, please go to the emergency department immediately for further evaluation. Follow-up as needed.

## 2023-09-28 NOTE — ED Provider Notes (Signed)
[facility]    CSN: [id number] Arrival date & time: 09/28/23  1125      History   Chief Complaint No chief complaint on file.   HPI [name] is a 72 y.o. female.   The history is provided by the patient.   Patient presents for complaints of cough, nasal congestion, and wheezing that is been present for the past week.  Patient denies fever, chills, headache, ear pain, difficulty breathing, chest pain, abdominal pain, nausea, vomiting, diarrhea, or rash.  Patient states that when she moves around, she gets short of breath but when she stops she breathes normally.  She also states that her O2 sats dropped but then they come "right back up."  Patient reports she has not taken any medication for her symptoms.  Of note, patient does have prior history of lung cancer, pneumothorax, cardiomegaly, hypertension and diabetes.  Reports her most recent hemoglobin A1c was 6.4.  Denies prior history of COPD or asthma.  Past Medical History:  Diagnosis Date   Aortic atherosclerosis (HCC) 05/2018   Aortic valve sclerosis 01/14/2015   Moderate without stenosis, noted on ECHO   Diabetes mellitus    Diverticulosis 12/18/2016   Mild, noted on colonoscopy   Endometrial cancer (HCC)    Metastatic, Stage IB grade 1, recurrent   Grade I diastolic dysfunction 01/14/2015   noted on ECHO   Heart murmur    History of cardiomegaly 2003   History of colon polyps    Hypertension    Internal hemorrhoids 12/18/2016   noted on colonoscopy   LAE (left atrial enlargement) 01/14/2015   Mild, noted on ECHO   Lung cancer (HCC)    LVH (left ventricular hypertrophy) 01/24/2015   Mild, noted on ECHO   Mass of right lung    Obesity    Pneumothorax on right 2005   History of    PONV (postoperative nausea and vomiting)    Pulmonary nodules    Bilateral   RBBB (right bundle branch block)    Renal cyst, left    pt unaware   Spondylosis    Mild, lumbar   Wears contact lenses    Wears  glasses    Wears partial dentures    upper and lower    Patient Active Problem List   Diagnosis Date Noted   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/22/2018   Special screening for malignant neoplasms, colon    Obesity 12/21/2014   Endometrial cancer (HCC) 10/20/2013    Past Surgical History:  Procedure Laterality Date   ABDOMINAL HYSTERECTOMY  04/2002   TAH   BILATERAL OOPHORECTOMY  1998   BREAST SURGERY     reduction   CESAREAN SECTION     x2   CHOLECYSTECTOMY     COLONOSCOPY N/A 12/18/2016   Procedure: COLONOSCOPY;  Surgeon: West Bali, MD;  Location: [facility];  Service: Endoscopy;  Laterality: N/A;  9:45 AM   REDUCTION MAMMAPLASTY Bilateral    thoracic lung biopsy Left    TONSILLECTOMY      OB History   No obstetric history on file.      Home Medications    Prior to Admission medications   Medication Sig Start Date End Date Taking? Authorizing Provider  albuterol (VENTOLIN HFA) 108 (90 Base) MCG/ACT inhaler Inhale 2 puffs into the lungs every 6 (six) hours as needed. 09/28/23  Yes Leath-Warren, Sadie Haber, NP  amlodipine-benazepril (LOTREL) 2.5-10 MG capsule Take 1 capsule by  mouth daily.   Yes [provider]  guaiFENesin (ROBITUSSIN) 100 MG/5ML liquid Take 10 mLs by mouth every 6 (six) hours as needed for cough or to loosen phlegm. 09/28/23  Yes Leath-Warren, Sadie Haber, NP  predniSONE (DELTASONE) 20 MG tablet Take 1 tablet (20 mg total) by mouth daily with breakfast for 7 days. 09/28/23 10/05/23 Yes Leath-Warren, Sadie Haber, NP  Calcium Carb-Cholecalciferol (CALCIUM 500+D PO) Take 1 tablet by mouth daily.    [provider]  cholecalciferol (VITAMIN D) 1000 UNITS tablet Take 1,000 Units by mouth daily.    [provider]  CINNAMON PO Take 1,000 mg by mouth daily.     [provider]  metFORMIN (GLUCOPHAGE) 500 MG tablet Take 1 tablet by mouth 2 (two) times daily. 09/22/21   [provider]   metoprolol succinate (TOPROL-XL) 25 MG 24 hr tablet Take 25 mg by mouth daily.    [provider]  Multiple Vitamin (MULTI VITAMIN DAILY PO) Take 1 each by mouth daily.    [provider]  Omega-3 Fatty Acids (FISH OIL) 1000 MG CAPS Take 1 each by mouth daily.     [provider]  simvastatin (ZOCOR) 20 MG tablet Take 20 mg by mouth every evening.     [provider]  triamcinolone cream (KENALOG) 0.1 % Apply 1 Application topically 2 (two) times daily. 06/13/22   Leath-Warren, Sadie Haber, NP  Turmeric 500 MG CAPS Take 1 capsule by mouth daily.     [provider]  vitamin C (ASCORBIC ACID) 500 MG tablet Take 500 mg by mouth daily.    [provider]    Family History Family History  Problem Relation Age of Onset   Ovarian cancer Mother    Heart Problems Father    Coronary artery disease Brother     Social History Social History   Tobacco Use   Smoking status: Never   Smokeless tobacco: Never  Vaping Use   Vaping status: Never Used  Substance Use Topics   Alcohol use: No   Drug use: No     Allergies   Compazine   Review of Systems Review of Systems Per HPI  Physical Exam Triage Vital Signs ED Triage Vitals  Encounter Vitals Group     BP 09/28/23 1148 136/74     Systolic BP Percentile --      Diastolic BP Percentile --      Pulse Rate 09/28/23 1148 (!) 56     Resp 09/28/23 1148 20     Temp 09/28/23 1148 98.3 F (36.8 C)     Temp Source 09/28/23 1148 Oral     SpO2 09/28/23 1148 (!) 89 %     Weight --      Height --      Head Circumference --      Peak Flow --      Pain Score 09/28/23 1150 0     Pain Loc --      Pain Education --      Exclude from Growth Chart --    No data found.  Updated Vital Signs BP 136/74 (BP Location: Right Arm)   Pulse (!) 56   Temp 98.3 F (36.8 C) (Oral)   Resp 20   SpO2 95%   Visual Acuity Right Eye Distance:   Left Eye Distance:   Bilateral Distance:    Right Eye  Near:   Left Eye Near:    Bilateral Near:  Physical Exam Vitals and nursing note reviewed.  Constitutional:      General: She is not in acute distress.    Appearance: Normal appearance.  HENT:     Head: Normocephalic.     Right Ear: Tympanic membrane, ear canal and external ear normal.     Left Ear: Tympanic membrane, ear canal and external ear normal.     Nose: Congestion present.     Mouth/Throat:     Mouth: Mucous membranes are moist.     Pharynx: No posterior oropharyngeal erythema.     Comments: Cobblestoning present to posterior oropharynx  Eyes:     Extraocular Movements: Extraocular movements intact.     Conjunctiva/sclera: Conjunctivae normal.     Pupils: Pupils are equal, round, and reactive to light.  Cardiovascular:     Rate and Rhythm: Regular rhythm. Bradycardia present.     Pulses: Normal pulses.     Heart sounds: Normal heart sounds.  Pulmonary:     Effort: Pulmonary effort is normal.     Breath sounds: Examination of the right-upper field reveals wheezing. Examination of the left-upper field reveals wheezing. Examination of the right-lower field reveals wheezing. Examination of the left-lower field reveals wheezing. Wheezing present.     Comments: Post nebulizer treatment, wheezing has completely ceased.  O2 sats up to 95% on room air.  Patient reports improvement of her breathing. Abdominal:     General: Bowel sounds are normal.     Palpations: Abdomen is soft.     Tenderness: There is no abdominal tenderness.  Musculoskeletal:     Cervical back: Normal range of motion.  Lymphadenopathy:     Cervical: No cervical adenopathy.  Skin:    General: Skin is warm and dry.  Neurological:     General: No focal deficit present.     Mental Status: She is alert and oriented to person, place, and time.  Psychiatric:        Mood and Affect: Mood normal.        Behavior: Behavior normal.      UC Treatments / Results  Labs (all labs ordered are listed, but  only abnormal results are displayed) Labs Reviewed - No data to display  EKG   Radiology No results found.  Procedures Procedures (including critical care time)  Medications Ordered in UC Medications  albuterol (PROVENTIL) (2.5 MG/3ML) 0.083% nebulizer solution 2.5 mg (2.5 mg Nebulization Given 09/28/23 1216)  methylPREDNISolone sodium succinate (SOLU-MEDROL) 125 mg/2 mL injection 60 mg (60 mg Intramuscular Given 09/28/23 1215)    Initial Impression / Assessment and Plan / UC Course  I have reviewed the triage vital signs and the nursing notes.  Pertinent labs & imaging results that were available during my care of the patient were reviewed by me and considered in my medical decision making (see chart for details).  Suspect patient may have acute bronchitis.  Patient presented with wheezing, post nebulizer treatment, wheezing significantly improved.  Patient was also administered Solu-Medrol 60 mg IM.  Will start patient on prednisone 20 mg for the next 5 days, Promethazine DM for her cough, and an albuterol inhaler for wheezing or shortness of breath.  Supportive care recommendations were provided and discussed with the patient to include fluids, rest, over-the-counter analgesics, and use of a humidifier during sleep.  Discussed indications with the patient regarding when follow-up be indicated.  Patient was in agreement with this plan of care and verbalized understanding.  All questions were answered.  Patient stable for  discharge.  Final Clinical Impressions(s) / UC Diagnoses   Final diagnoses:  Acute bronchitis, unspecified organism     Discharge Instructions      You were given an injection of Solu-Medrol 60 mg today.  Do not start the prednisone until 09/29/2023. Take medication as prescribed. Increase fluids and allow for plenty of rest. Recommend using a humidifier at nighttime during sleep and sleeping slightly elevated on pillows while cough symptoms persist. Continue  to monitor your symptoms are worsening.  If you develop new symptoms such as fever, chills, or worsening wheezing or shortness of breath, please go to the emergency department immediately for further evaluation. Follow-up as needed.     ED Prescriptions     Medication Sig Dispense Auth. Provider   predniSONE (DELTASONE) 20 MG tablet Take 1 tablet (20 mg total) by mouth daily with breakfast for 7 days. 7 tablet Leath-Warren, Sadie Haber, NP   albuterol (VENTOLIN HFA) 108 (90 Base) MCG/ACT inhaler Inhale 2 puffs into the lungs every 6 (six) hours as needed. 8 g Leath-Warren, Sadie Haber, NP   guaiFENesin (ROBITUSSIN) 100 MG/5ML liquid Take 10 mLs by mouth every 6 (six) hours as needed for cough or to loosen phlegm. 300 mL Leath-Warren, Sadie Haber, NP      PDMP not reviewed this encounter.   Abran Cantor, NP 09/28/23 1243

## 2023-09-28 NOTE — ED Triage Notes (Signed)
Wheezing, cough and nasal congestion x 1 week.

## 2023-10-04 ENCOUNTER — Ambulatory Visit
Admission: RE | Admit: 2023-10-04 | Discharge: 2023-10-04 | Disposition: A | Discharge status: Home / Self Care | Payer: Medicare Other | Source: Ambulatory Visit | Attending: Family Medicine | Admitting: Family Medicine

## 2023-10-04 VITALS — BP 150/78 | HR 53 | Temp 98.1°F | Resp 20

## 2023-10-04 DIAGNOSIS — J22 Unspecified acute lower respiratory infection: Secondary | ICD-10-CM

## 2023-10-04 MED ORDER — AZITHROMYCIN 250 MG PO TABS: ORAL_TABLET | ORAL | 0 refills | Status: DC

## 2023-10-04 MED ORDER — ALBUTEROL SULFATE HFA 108 (90 BASE) MCG/ACT IN AERS: 2.0000 | INHALATION_SPRAY | RESPIRATORY_TRACT | 0 refills | Status: AC | PRN

## 2023-10-04 NOTE — Discharge Instructions (Signed)
Continue the medications that you are currently taking and that the antibiotic that has been sent over.  I sent over a second inhaler just in case you run out of the initial inhaler.  Follow-up for ongoing or worsening symptoms.

## 2023-10-04 NOTE — ED Provider Notes (Signed)
[facility]    CSN: [id number] Arrival date & time: 10/04/23  1025      History   Chief Complaint No chief complaint on file.   HPI [name] is a 72 y.o. female.   Patient presenting today with 2-week history of congestion, cough, fever.  Was seen almost a week ago, given prednisone, albuterol and cough syrup but states she is not significantly improved and feels like her cough is actually worsening.  Denies chest pain, shortness of breath, abdominal pain, nausea vomiting diarrhea, history of known chronic pulmonary disease but does have a history of lung cancer.    Past Medical History:  Diagnosis Date   Aortic atherosclerosis (HCC) 05/2018   Aortic valve sclerosis 01/14/2015   Moderate without stenosis, noted on ECHO   Diabetes mellitus    Diverticulosis 12/18/2016   Mild, noted on colonoscopy   Endometrial cancer (HCC)    Metastatic, Stage IB grade 1, recurrent   Grade I diastolic dysfunction 01/14/2015   noted on ECHO   Heart murmur    History of cardiomegaly 2003   History of colon polyps    Hypertension    Internal hemorrhoids 12/18/2016   noted on colonoscopy   LAE (left atrial enlargement) 01/14/2015   Mild, noted on ECHO   Lung cancer (HCC)    LVH (left ventricular hypertrophy) 01/24/2015   Mild, noted on ECHO   Mass of right lung    Obesity    Pneumothorax on right 2005   History of    PONV (postoperative nausea and vomiting)    Pulmonary nodules    Bilateral   RBBB (right bundle branch block)    Renal cyst, left    pt unaware   Spondylosis    Mild, lumbar   Wears contact lenses    Wears glasses    Wears partial dentures    upper and lower    Patient Active Problem List   Diagnosis Date Noted   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/22/2018   Special screening for malignant neoplasms, colon    Obesity 12/21/2014   Endometrial cancer (HCC) 10/20/2013    Past Surgical History:  Procedure Laterality Date    ABDOMINAL HYSTERECTOMY  04/2002   TAH   BILATERAL OOPHORECTOMY  1998   BREAST SURGERY     reduction   CESAREAN SECTION     x2   CHOLECYSTECTOMY     COLONOSCOPY N/A 12/18/2016   Procedure: COLONOSCOPY;  Surgeon: West Bali, MD;  Location: [facility];  Service: Endoscopy;  Laterality: N/A;  9:45 AM   REDUCTION MAMMAPLASTY Bilateral    thoracic lung biopsy Left    TONSILLECTOMY      OB History   No obstetric history on file.      Home Medications    Prior to Admission medications   Medication Sig Start Date End Date Taking? Authorizing Provider  albuterol (VENTOLIN HFA) 108 (90 Base) MCG/ACT inhaler Inhale 2 puffs into the lungs every 4 (four) hours as needed. 10/04/23  Yes Particia Nearing, PA-C  azithromycin (ZITHROMAX) 250 MG tablet Take first 2 tablets together, then 1 every day until finished. 10/04/23  Yes Particia Nearing, PA-C  albuterol (VENTOLIN HFA) 108 (90 Base) MCG/ACT inhaler Inhale 2 puffs into the lungs every 6 (six) hours as needed. 09/28/23   Leath-Warren, Sadie Haber, NP  amlodipine-benazepril (LOTREL) 2.5-10 MG capsule Take 1 capsule by mouth daily.    [provider]  Calcium Carb-Cholecalciferol (CALCIUM 500+D PO) Take 1 tablet by mouth daily.    [provider]  cholecalciferol (VITAMIN D) 1000 UNITS tablet Take 1,000 Units by mouth daily.    [provider]  CINNAMON PO Take 1,000 mg by mouth daily.     [provider]  guaiFENesin (ROBITUSSIN) 100 MG/5ML liquid Take 10 mLs by mouth every 6 (six) hours as needed for cough or to loosen phlegm. 09/28/23   Leath-Warren, Sadie Haber, NP  metFORMIN (GLUCOPHAGE) 500 MG tablet Take 1 tablet by mouth 2 (two) times daily. 09/22/21   [provider]  metoprolol succinate (TOPROL-XL) 25 MG 24 hr tablet Take 25 mg by mouth daily.    [provider]  Multiple Vitamin (MULTI VITAMIN DAILY PO) Take 1 each by mouth daily.    [provider]   Omega-3 Fatty Acids (FISH OIL) 1000 MG CAPS Take 1 each by mouth daily.     [provider]  predniSONE (DELTASONE) 20 MG tablet Take 1 tablet (20 mg total) by mouth daily with breakfast for 7 days. 09/28/23 10/05/23  Leath-Warren, Sadie Haber, NP  simvastatin (ZOCOR) 20 MG tablet Take 20 mg by mouth every evening.     [provider]  triamcinolone cream (KENALOG) 0.1 % Apply 1 Application topically 2 (two) times daily. 06/13/22   Leath-Warren, Sadie Haber, NP  Turmeric 500 MG CAPS Take 1 capsule by mouth daily.     [provider]  vitamin C (ASCORBIC ACID) 500 MG tablet Take 500 mg by mouth daily.    [provider]    Family History Family History  Problem Relation Age of Onset   Ovarian cancer Mother    Heart Problems Father    Coronary artery disease Brother     Social History Social History   Tobacco Use   Smoking status: Never   Smokeless tobacco: Never  Vaping Use   Vaping status: Never Used  Substance Use Topics   Alcohol use: No   Drug use: No     Allergies   Compazine   Review of Systems Review of Systems Per HPI  Physical Exam Triage Vital Signs ED Triage Vitals [10/04/23 1042]  Encounter Vitals Group     BP (!) 150/78     Systolic BP Percentile      Diastolic BP Percentile      Pulse Rate (!) 53     Resp 20     Temp 98.1 F (36.7 C)     Temp Source Oral     SpO2 93 %     Weight      Height      Head Circumference      Peak Flow      Pain Score 0     Pain Loc      Pain Education      Exclude from Growth Chart    No data found.  Updated Vital Signs BP (!) 150/78 (BP Location: Right Arm)   Pulse (!) 53   Temp 98.1 F (36.7 C) (Oral)   Resp 20   SpO2 93%   Visual Acuity Right Eye Distance:   Left Eye Distance:   Bilateral Distance:    Right Eye Near:   Left Eye Near:    Bilateral Near:     Physical Exam Vitals and nursing note reviewed.  Constitutional:      Appearance: Normal appearance.   HENT:     Head: Atraumatic.  Right Ear: Tympanic membrane and external ear normal.     Left Ear: Tympanic membrane and external ear normal.     Nose: Congestion present.     Mouth/Throat:     Mouth: Mucous membranes are moist.     Pharynx: Posterior oropharyngeal erythema present.  Eyes:     Extraocular Movements: Extraocular movements intact.     Conjunctiva/sclera: Conjunctivae normal.  Cardiovascular:     Rate and Rhythm: Normal rate and regular rhythm.     Heart sounds: Normal heart sounds.  Pulmonary:     Effort: Pulmonary effort is normal.     Breath sounds: Normal breath sounds. No wheezing or rales.  Musculoskeletal:        General: Normal range of motion.     Cervical back: Normal range of motion and neck supple.  Skin:    General: Skin is warm and dry.  Neurological:     Mental Status: She is alert and oriented to person, place, and time.  Psychiatric:        Mood and Affect: Mood normal.        Thought Content: Thought content normal.      UC Treatments / Results  Labs (all labs ordered are listed, but only abnormal results are displayed) Labs Reviewed - No data to display  EKG   Radiology No results found.  Procedures Procedures (including critical care time)  Medications Ordered in UC Medications - No data to display  Initial Impression / Assessment and Plan / UC Course  I have reviewed the triage vital signs and the nursing notes.  Pertinent labs & imaging results that were available during my care of the patient were reviewed by me and considered in my medical decision making (see chart for details).     Given duration worsening course, will add Zithromax and complete prednisone, continue albuterol and cough medication.  Discussed supportive over-the-counter medications and home care additionally.  Return for worsening symptoms.  Final Clinical Impressions(s) / UC Diagnoses   Final diagnoses:  Lower respiratory infection      Discharge Instructions      Continue the medications that you are currently taking and that the antibiotic that has been sent over.  I sent over a second inhaler just in case you run out of the initial inhaler.  Follow-up for ongoing or worsening symptoms.    ED Prescriptions     Medication Sig Dispense Auth. Provider   azithromycin (ZITHROMAX) 250 MG tablet Take first 2 tablets together, then 1 every day until finished. 6 tablet Particia Nearing, [place]   albuterol (VENTOLIN HFA) 108 (90 Base) MCG/ACT inhaler Inhale 2 puffs into the lungs every 4 (four) hours as needed. 18 g Particia Nearing, [place]      PDMP not reviewed this encounter.   Particia Nearing, [place] 10/04/23 1137

## 2023-10-04 NOTE — ED Triage Notes (Signed)
Pt reports she still has nasal congestion, cough, and fever x 2 weeks     States prednisone didn't work.

## 2024-01-03 DIAGNOSIS — N1832 Chronic kidney disease, stage 3b: Secondary | ICD-10-CM | POA: Diagnosis not present

## 2024-01-03 DIAGNOSIS — E875 Hyperkalemia: Secondary | ICD-10-CM | POA: Diagnosis not present

## 2024-01-03 DIAGNOSIS — E1122 Type 2 diabetes mellitus with diabetic chronic kidney disease: Secondary | ICD-10-CM | POA: Diagnosis not present

## 2024-01-03 DIAGNOSIS — I129 Hypertensive chronic kidney disease with stage 1 through stage 4 chronic kidney disease, or unspecified chronic kidney disease: Secondary | ICD-10-CM | POA: Diagnosis not present

## 2024-01-03 DIAGNOSIS — E1129 Type 2 diabetes mellitus with other diabetic kidney complication: Secondary | ICD-10-CM | POA: Diagnosis not present

## 2024-01-03 DIAGNOSIS — N281 Cyst of kidney, acquired: Secondary | ICD-10-CM | POA: Diagnosis not present

## 2024-01-04 DIAGNOSIS — N1832 Chronic kidney disease, stage 3b: Secondary | ICD-10-CM | POA: Diagnosis not present

## 2024-02-03 DIAGNOSIS — I10 Essential (primary) hypertension: Secondary | ICD-10-CM | POA: Diagnosis not present

## 2024-04-18 DIAGNOSIS — N1832 Chronic kidney disease, stage 3b: Secondary | ICD-10-CM | POA: Diagnosis not present

## 2024-04-24 DIAGNOSIS — E875 Hyperkalemia: Secondary | ICD-10-CM | POA: Diagnosis not present

## 2024-04-24 DIAGNOSIS — N281 Cyst of kidney, acquired: Secondary | ICD-10-CM | POA: Diagnosis not present

## 2024-04-24 DIAGNOSIS — E1122 Type 2 diabetes mellitus with diabetic chronic kidney disease: Secondary | ICD-10-CM | POA: Diagnosis not present

## 2024-04-24 DIAGNOSIS — R809 Proteinuria, unspecified: Secondary | ICD-10-CM | POA: Diagnosis not present

## 2024-04-24 DIAGNOSIS — I129 Hypertensive chronic kidney disease with stage 1 through stage 4 chronic kidney disease, or unspecified chronic kidney disease: Secondary | ICD-10-CM | POA: Diagnosis not present

## 2024-04-24 DIAGNOSIS — N1832 Chronic kidney disease, stage 3b: Secondary | ICD-10-CM | POA: Diagnosis not present

## 2024-05-05 DIAGNOSIS — E1129 Type 2 diabetes mellitus with other diabetic kidney complication: Secondary | ICD-10-CM | POA: Diagnosis not present

## 2024-05-13 ENCOUNTER — Encounter: Payer: Self-pay | Admitting: Emergency Medicine

## 2024-05-13 ENCOUNTER — Ambulatory Visit
Admission: EM | Admit: 2024-05-13 | Discharge: 2024-05-13 | Disposition: A | Discharge status: Home / Self Care | Attending: Nurse Practitioner | Admitting: Nurse Practitioner

## 2024-05-13 DIAGNOSIS — R309 Painful micturition, unspecified: Secondary | ICD-10-CM | POA: Insufficient documentation

## 2024-05-13 DIAGNOSIS — E119 Type 2 diabetes mellitus without complications: Secondary | ICD-10-CM | POA: Diagnosis not present

## 2024-05-13 DIAGNOSIS — R399 Unspecified symptoms and signs involving the genitourinary system: Secondary | ICD-10-CM | POA: Insufficient documentation

## 2024-05-13 DIAGNOSIS — Z7984 Long term (current) use of oral hypoglycemic drugs: Secondary | ICD-10-CM | POA: Diagnosis not present

## 2024-05-13 LAB — POCT URINE DIPSTICK
Bilirubin, UA: NEGATIVE
Glucose, UA: >=1000 mg/dL — AB
Ketones, POC UA: NEGATIVE mg/dL
Nitrite, UA: NEGATIVE
Protein Ur, POC: 100 mg/dL — AB
Spec Grav, UA: >=1.03 — AB (ref 1.010–1.025)
Urobilinogen, UA: 0.2 U/dL
pH, UA: 5.5 (ref 5.0–8.0)

## 2024-05-13 MED ORDER — CEPHALEXIN 500 MG PO CAPS: 500.0000 mg | ORAL_CAPSULE | Freq: Two times a day (BID) | ORAL | 0 refills | Status: AC

## 2024-05-13 NOTE — Discharge Instructions (Addendum)
 A urine culture has been ordered.  You will be contacted if the results of the culture show that the antibiotic prescribed today needs to be discontinued or if the medication needs to be changed.  You will also have access to your results via MyChart.   You may take over-the-counter Tylenol  as needed for pain, fever, or general discomfort. Increase fluids and allow for plenty of rest.  Try to drink at least 8-10 8 ounce glasses of water daily while symptoms persist. Avoid caffeine such as tea, soda, or coffee while symptoms persist. Develop a toileting schedule that will allow you to urinate at least every 2 hours. If the results of your urine culture are negative and you are continuing to experience symptoms, recommend follow-up with your primary care physician for further evaluation. Follow-up as needed.

## 2024-05-13 NOTE — ED Provider Notes (Signed)
 [facility]    CSN: [id number] Arrival date & time: 05/13/24  1254      History   Chief Complaint No chief complaint on file.   HPI [name] is a 73 y.o. female.   The history is provided by the patient.   Patient presents with a 2-day history of burning with urination.  Patient denies fever, chills, hematuria, flank pain, low back pain, decreased urine stream, or vaginal symptoms.  Patient states she was concerned that she may also have a yeast infection.  Patient denies prior history of recurrent UTIs.  So far she has not tried any medication for her symptoms.  Further denies history of kidney stones or kidney infection.  States she does take Farxiga for her diabetes.  Past Medical History:  Diagnosis Date   Aortic atherosclerosis (HCC) 05/2018   Aortic valve sclerosis 01/14/2015   Moderate without stenosis, noted on ECHO   Diabetes mellitus    Diverticulosis 12/18/2016   Mild, noted on colonoscopy   Endometrial cancer (HCC)    Metastatic, Stage IB grade 1, recurrent   Grade I diastolic dysfunction 01/14/2015   noted on ECHO   Heart murmur    History of cardiomegaly 2003   History of colon polyps    Hypertension    Internal hemorrhoids 12/18/2016   noted on colonoscopy   LAE (left atrial enlargement) 01/14/2015   Mild, noted on ECHO   Lung cancer (HCC)    LVH (left ventricular hypertrophy) 01/24/2015   Mild, noted on ECHO   Mass of right lung    Obesity    Pneumothorax on right 2005   History of    PONV (postoperative nausea and vomiting)    Pulmonary nodules    Bilateral   RBBB (right bundle branch block)    Renal cyst, left    pt unaware   Spondylosis    Mild, lumbar   Wears contact lenses    Wears glasses    Wears partial dentures    upper and lower    Patient Active Problem List   Diagnosis Date Noted   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/22/2018   Special screening for malignant neoplasms, colon    Obesity  12/21/2014   Endometrial cancer (HCC) 10/20/2013    Past Surgical History:  Procedure Laterality Date   ABDOMINAL HYSTERECTOMY  04/2002   TAH   BILATERAL OOPHORECTOMY  1998   BREAST SURGERY     reduction   CESAREAN SECTION     x2   CHOLECYSTECTOMY     COLONOSCOPY N/A 12/18/2016   Procedure: COLONOSCOPY;  Surgeon: Margo LITTIE Haddock, MD;  Location: [facility];  Service: Endoscopy;  Laterality: N/A;  9:45 AM   REDUCTION MAMMAPLASTY Bilateral    thoracic lung biopsy Left    TONSILLECTOMY      OB History   No obstetric history on file.      Home Medications    Prior to Admission medications   Medication Sig Start Date End Date Taking? Authorizing Provider  cephALEXin  (KEFLEX ) 500 MG capsule Take 1 capsule (500 mg total) by mouth 2 (two) times daily for 7 days. 05/13/24 05/20/24 Yes Leath-Warren, Etta PARAS, NP  dapagliflozin propanediol (FARXIGA) 10 MG TABS tablet Take by mouth daily.   Yes [provider]  albuterol  (VENTOLIN  HFA) 108 (90 Base) MCG/ACT inhaler Inhale 2 puffs into the lungs every 6 (six) hours as needed. 09/28/23   Leath-Warren, Etta PARAS, NP  albuterol  (  VENTOLIN  HFA) 108 (90 Base) MCG/ACT inhaler Inhale 2 puffs into the lungs every 4 (four) hours as needed. 10/04/23   Stuart Vernell Norris, PA-C  amlodipine-benazepril (LOTREL) 2.5-10 MG capsule Take 1 capsule by mouth daily.    [provider]  Calcium Carb-Cholecalciferol (CALCIUM 500+D PO) Take 1 tablet by mouth daily.    [provider]  cholecalciferol (VITAMIN D) 1000 UNITS tablet Take 1,000 Units by mouth daily.    [provider]  CINNAMON PO Take 1,000 mg by mouth daily.     [provider]  metoprolol succinate (TOPROL-XL) 25 MG 24 hr tablet Take 25 mg by mouth daily.    [provider]  Multiple Vitamin (MULTI VITAMIN DAILY PO) Take 1 each by mouth daily.    [provider]  Omega-3 Fatty Acids (FISH OIL) 1000 MG CAPS Take 1 each by mouth daily.      [provider]  simvastatin (ZOCOR) 20 MG tablet Take 20 mg by mouth every evening.     [provider]  vitamin C (ASCORBIC ACID) 500 MG tablet Take 500 mg by mouth daily.    [provider]    Family History Family History  Problem Relation Age of Onset   Ovarian cancer Mother    Heart Problems Father    Coronary artery disease Brother     Social History Social History   Tobacco Use   Smoking status: Never   Smokeless tobacco: Never  Vaping Use   Vaping status: Never Used  Substance Use Topics   Alcohol use: No   Drug use: No     Allergies   Compazine   Review of Systems Review of Systems Per HPI  Physical Exam Triage Vital Signs ED Triage Vitals [05/13/24 1332]  Encounter Vitals Group     BP 128/68     Girls Systolic BP Percentile      Girls Diastolic BP Percentile      Boys Systolic BP Percentile      Boys Diastolic BP Percentile      Pulse Rate (!) 48     Resp 18     Temp 97.9 F (36.6 C)     Temp Source Oral     SpO2 96 %     Weight      Height      Head Circumference      Peak Flow      Pain Score 0     Pain Loc      Pain Education      Exclude from Growth Chart    No data found.  Updated Vital Signs BP 128/68 (BP Location: Right Arm)   Pulse (!) 48   Temp 97.9 F (36.6 C) (Oral)   Resp 18   SpO2 96%   Visual Acuity Right Eye Distance:   Left Eye Distance:   Bilateral Distance:    Right Eye Near:   Left Eye Near:    Bilateral Near:     Physical Exam Vitals and nursing note reviewed.  Constitutional:      General: She is not in acute distress.    Appearance: Normal appearance.  HENT:     Head: Normocephalic.  Eyes:     Extraocular Movements: Extraocular movements intact.     Pupils: Pupils are equal, round, and reactive to light.  Cardiovascular:     Rate and Rhythm: Normal rate and regular rhythm.     Pulses: Normal pulses.  Heart sounds: Normal heart sounds.  Pulmonary:     Effort:  Pulmonary effort is normal. No respiratory distress.     Breath sounds: Normal breath sounds. No stridor. No wheezing, rhonchi or rales.  Abdominal:     General: Bowel sounds are normal.     Palpations: Abdomen is soft.     Tenderness: There is no abdominal tenderness. There is no right CVA tenderness or left CVA tenderness.  Musculoskeletal:     Cervical back: Normal range of motion.  Skin:    General: Skin is warm and dry.  Neurological:     General: No focal deficit present.     Mental Status: She is alert and oriented to person, place, and time.  Psychiatric:        Mood and Affect: Mood normal.        Behavior: Behavior normal.      UC Treatments / Results  Labs (all labs ordered are listed, but only abnormal results are displayed) Labs Reviewed  POCT URINE DIPSTICK - Abnormal; Notable for the following components:      Result Value   Clarity, UA cloudy (*)    Glucose, UA >=1,000 (*)    Spec Grav, UA >=1.030 (*)    Blood, UA trace-intact (*)    Protein Ur, POC =100 (*)    Leukocytes, UA Trace (*)    All other components within normal limits  URINE CULTURE    EKG   Radiology No results found.  Procedures Procedures (including critical care time)  Medications Ordered in UC Medications - No data to display  Initial Impression / Assessment and Plan / UC Course  I have reviewed the triage vital signs and the nursing notes.  Pertinent labs & imaging results that were available during my care of the patient were reviewed by me and considered in my medical decision making (see chart for details).  Urinalysis is suggestive of a UTI.  Will provide empiric treatment with Keflex  500 mg.  Urine culture is pending.  If culture results are negative, patient was advised to follow-up with her PCP for further evaluation, would recommend cytology swab at that time.  Supportive care recommendations were provided discussed with the patient to include fluids, over-the-counter  analgesics, developing a toileting schedule, and avoiding caffeine.  Discussed indications with patient regarding follow-up.  Patient was in agreement with this plan of care and verbalizes understanding.  All questions were answered.  Patient stable for discharge.   Final Clinical Impressions(s) / UC Diagnoses   Final diagnoses:  UTI symptoms     Discharge Instructions      A urine culture has been ordered.  You will be contacted if the results of the culture show that the antibiotic prescribed today needs to be discontinued or if the medication needs to be changed.  You will also have access to your results via MyChart.   You may take over-the-counter Tylenol  as needed for pain, fever, or general discomfort. Increase fluids and allow for plenty of rest.  Try to drink at least 8-10 8 ounce glasses of water daily while symptoms persist. Avoid caffeine such as tea, soda, or coffee while symptoms persist. Develop a toileting schedule that will allow you to urinate at least every 2 hours. If the results of your urine culture are negative and you are continuing to experience symptoms, recommend follow-up with your primary care physician for further evaluation. Follow-up as needed.     ED Prescriptions  Medication Sig Dispense Auth. Provider   cephALEXin  (KEFLEX ) 500 MG capsule Take 1 capsule (500 mg total) by mouth 2 (two) times daily for 7 days. 14 capsule Leath-Warren, Etta PARAS, NP      PDMP not reviewed this encounter.   Gilmer Etta PARAS, NP 05/13/24 1401

## 2024-05-13 NOTE — ED Triage Notes (Signed)
Burning on urination x 2 days.   °

## 2024-05-15 ENCOUNTER — Ambulatory Visit (HOSPITAL_COMMUNITY): Payer: Self-pay

## 2024-05-15 LAB — URINE CULTURE: Culture: >=100000 — AB

## 2024-06-04 ENCOUNTER — Ambulatory Visit
Admission: EM | Admit: 2024-06-04 | Discharge: 2024-06-04 | Disposition: A | Discharge status: Home / Self Care | Attending: Family Medicine | Admitting: Family Medicine

## 2024-06-04 ENCOUNTER — Encounter: Payer: Self-pay | Admitting: Emergency Medicine

## 2024-06-04 ENCOUNTER — Other Ambulatory Visit: Payer: Self-pay

## 2024-06-04 DIAGNOSIS — N390 Urinary tract infection, site not specified: Secondary | ICD-10-CM | POA: Diagnosis not present

## 2024-06-04 LAB — POCT URINE DIPSTICK
Bilirubin, UA: NEGATIVE
Glucose, UA: NEGATIVE mg/dL
Ketones, POC UA: NEGATIVE mg/dL
Nitrite, UA: NEGATIVE
POC PROTEIN,UA: >=300 — AB
Spec Grav, UA: 1.02 (ref 1.010–1.025)
Urobilinogen, UA: 0.2 U/dL
pH, UA: 5.5 (ref 5.0–8.0)

## 2024-06-04 MED ORDER — CEPHALEXIN 500 MG PO CAPS: 500.0000 mg | ORAL_CAPSULE | Freq: Two times a day (BID) | ORAL | 0 refills | Status: AC

## 2024-06-04 NOTE — ED Provider Notes (Signed)
 [facility]    CSN: [id number] Arrival date & time: 06/04/24  1240      History   Chief Complaint Chief Complaint  Patient presents with   Dysuria    HPI [name] is a 73 y.o. female.   Patient presenting today with 1 day history of dysuria.  Denies fever, chills, abdominal pain, nausea, vomiting, vaginal symptoms.  So far not trying anything over-the-counter for symptoms.  Does have a history of urinary tract infections.    Past Medical History:  Diagnosis Date   Aortic atherosclerosis (HCC) 05/2018   Aortic valve sclerosis 01/14/2015   Moderate without stenosis, noted on ECHO   Diabetes mellitus    Diverticulosis 12/18/2016   Mild, noted on colonoscopy   Endometrial cancer (HCC)    Metastatic, Stage IB grade 1, recurrent   Grade I diastolic dysfunction 01/14/2015   noted on ECHO   Heart murmur    History of cardiomegaly 2003   History of colon polyps    Hypertension    Internal hemorrhoids 12/18/2016   noted on colonoscopy   LAE (left atrial enlargement) 01/14/2015   Mild, noted on ECHO   Lung cancer (HCC)    LVH (left ventricular hypertrophy) 01/24/2015   Mild, noted on ECHO   Mass of right lung    Obesity    Pneumothorax on right 2005   History of    PONV (postoperative nausea and vomiting)    Pulmonary nodules    Bilateral   RBBB (right bundle branch block)    Renal cyst, left    pt unaware   Spondylosis    Mild, lumbar   Wears contact lenses    Wears glasses    Wears partial dentures    upper and lower    Patient Active Problem List   Diagnosis Date Noted   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/22/2018   Special screening for malignant neoplasms, colon    Obesity 12/21/2014   Endometrial cancer (HCC) 10/20/2013    Past Surgical History:  Procedure Laterality Date   ABDOMINAL HYSTERECTOMY  04/2002   TAH   BILATERAL OOPHORECTOMY  1998   BREAST SURGERY     reduction   CESAREAN SECTION     x2    CHOLECYSTECTOMY     COLONOSCOPY N/A 12/18/2016   Procedure: COLONOSCOPY;  Surgeon: Margo LITTIE Haddock, MD;  Location: [facility];  Service: Endoscopy;  Laterality: N/A;  9:45 AM   REDUCTION MAMMAPLASTY Bilateral    thoracic lung biopsy Left    TONSILLECTOMY      OB History   No obstetric history on file.      Home Medications    Prior to Admission medications   Medication Sig Start Date End Date Taking? Authorizing Provider  cephALEXin  (KEFLEX ) 500 MG capsule Take 1 capsule (500 mg total) by mouth 2 (two) times daily. 06/04/24  Yes Stuart Vernell Norris, PA-C  albuterol  (VENTOLIN  HFA) 108 [phone] Base) MCG/ACT inhaler Inhale 2 puffs into the lungs every 6 (six) hours as needed. 09/28/23   Leath-Warren, Etta PARAS, NP  albuterol  (VENTOLIN  HFA) 108 (90 Base) MCG/ACT inhaler Inhale 2 puffs into the lungs every 4 (four) hours as needed. 10/04/23   Stuart Vernell Norris, PA-C  amlodipine-benazepril (LOTREL) 2.5-10 MG capsule Take 1 capsule by mouth daily.    [provider]  Calcium Carb-Cholecalciferol (CALCIUM 500+D PO) Take 1 tablet by mouth daily.    [provider]  cholecalciferol (VITAMIN D)  1000 UNITS tablet Take 1,000 Units by mouth daily.    [provider]  CINNAMON PO Take 1,000 mg by mouth daily.     [provider]  dapagliflozin propanediol (FARXIGA) 10 MG TABS tablet Take by mouth daily.    [provider]  metoprolol succinate (TOPROL-XL) 25 MG 24 hr tablet Take 25 mg by mouth daily.    [provider]  Multiple Vitamin (MULTI VITAMIN DAILY PO) Take 1 each by mouth daily.    [provider]  Omega-3 Fatty Acids (FISH OIL) 1000 MG CAPS Take 1 each by mouth daily.     [provider]  simvastatin (ZOCOR) 20 MG tablet Take 20 mg by mouth every evening.     [provider]  vitamin C (ASCORBIC ACID) 500 MG tablet Take 500 mg by mouth daily.    [provider]    Family History Family History   Problem Relation Age of Onset   Ovarian cancer Mother    Heart Problems Father    Coronary artery disease Brother     Social History Social History   Tobacco Use   Smoking status: Never   Smokeless tobacco: Never  Vaping Use   Vaping status: Never Used  Substance Use Topics   Alcohol use: No   Drug use: No     Allergies   Compazine   Review of Systems Review of Systems Per HPI  Physical Exam Triage Vital Signs ED Triage Vitals  Encounter Vitals Group     BP 06/04/24 1418 (!) 141/63     Girls Systolic BP Percentile --      Girls Diastolic BP Percentile --      Boys Systolic BP Percentile --      Boys Diastolic BP Percentile --      Pulse Rate 06/04/24 1418 (!) 48     Resp 06/04/24 1418 20     Temp 06/04/24 1418 97.6 F (36.4 C)     Temp Source 06/04/24 1418 Oral     SpO2 06/04/24 1418 97 %     Weight --      Height --      Head Circumference --      Peak Flow --      Pain Score 06/04/24 1416 0     Pain Loc --      Pain Education --      Exclude from Growth Chart --    No data found.  Updated Vital Signs BP (!) 141/63 (BP Location: Right Arm)   Pulse (!) 48 Comment: pt reports HR is at baseline.  Temp 97.6 F (36.4 C) (Oral)   Resp 20   SpO2 97%   Visual Acuity Right Eye Distance:   Left Eye Distance:   Bilateral Distance:    Right Eye Near:   Left Eye Near:    Bilateral Near:     Physical Exam Vitals and nursing note reviewed.  Constitutional:      Appearance: Normal appearance. She is not ill-appearing.  HENT:     Head: Atraumatic.  Eyes:     Extraocular Movements: Extraocular movements intact.     Conjunctiva/sclera: Conjunctivae normal.  Cardiovascular:     Rate and Rhythm: Normal rate.  Pulmonary:     Effort: Pulmonary effort is normal.     Breath sounds: Normal breath sounds.  Abdominal:     General: Bowel sounds are normal. There is no distension.     Palpations: Abdomen is soft.  Tenderness: There is no abdominal  tenderness. There is no right CVA tenderness, left CVA tenderness or guarding.  Musculoskeletal:        General: Normal range of motion.     Cervical back: Normal range of motion and neck supple.  Skin:    General: Skin is warm and dry.  Neurological:     Mental Status: She is alert and oriented to person, place, and time.  Psychiatric:        Mood and Affect: Mood normal.        Thought Content: Thought content normal.        Judgment: Judgment normal.      UC Treatments / Results  Labs (all labs ordered are listed, but only abnormal results are displayed) Labs Reviewed  POCT URINE DIPSTICK - Abnormal; Notable for the following components:      Result Value   Blood, UA moderate (*)    POC PROTEIN,UA >=300 (*)    Leukocytes, UA Trace (*)    All other components within normal limits  URINE CULTURE    EKG   Radiology No results found.  Procedures Procedures (including critical care time)  Medications Ordered in UC Medications - No data to display  Initial Impression / Assessment and Plan / UC Course  I have reviewed the triage vital signs and the nursing notes.  Pertinent labs & imaging results that were available during my care of the patient were reviewed by me and considered in my medical decision making (see chart for details).     Vital signs and exam overall reassuring today, urinalysis today with evidence of a urinary tract infection.  Urine culture pending, treat with Keflex , fluids and await culture to see if changes need to be made.  Return for worsening symptoms.  Final Clinical Impressions(s) / UC Diagnoses   Final diagnoses:  Acute lower UTI   Discharge Instructions   None    ED Prescriptions     Medication Sig Dispense Auth. Provider   cephALEXin  (KEFLEX ) 500 MG capsule Take 1 capsule (500 mg total) by mouth 2 (two) times daily. 10 capsule Stuart Vernell Norris, [place]      PDMP not reviewed this encounter.   Stuart Vernell Norris,  [place] 06/04/24 [phone]

## 2024-06-04 NOTE — ED Triage Notes (Signed)
 Pt reports dysuria since last night. Pt denies any abd pain, nausea, fever. Has tried tylenol  this am.

## 2024-06-05 ENCOUNTER — Encounter (HOSPITAL_COMMUNITY): Payer: Self-pay | Admitting: *Deleted

## 2024-06-05 ENCOUNTER — Emergency Department (HOSPITAL_COMMUNITY)
Admission: EM | Admit: 2024-06-05 | Discharge: 2024-06-05 | Disposition: A | Discharge status: Home / Self Care | Attending: Emergency Medicine | Admitting: Emergency Medicine

## 2024-06-05 ENCOUNTER — Other Ambulatory Visit: Payer: Self-pay

## 2024-06-05 DIAGNOSIS — R350 Frequency of micturition: Secondary | ICD-10-CM | POA: Diagnosis present

## 2024-06-05 DIAGNOSIS — N390 Urinary tract infection, site not specified: Secondary | ICD-10-CM | POA: Diagnosis not present

## 2024-06-05 DIAGNOSIS — Z79899 Other long term (current) drug therapy: Secondary | ICD-10-CM | POA: Insufficient documentation

## 2024-06-05 LAB — URINALYSIS, ROUTINE W REFLEX MICROSCOPIC
Bilirubin Urine: NEGATIVE
Glucose, UA: >=500 mg/dL — AB
Hgb urine dipstick: NEGATIVE
Ketones, ur: NEGATIVE mg/dL
Nitrite: NEGATIVE
Protein, ur: 100 mg/dL — AB
Specific Gravity, Urine: 1.018 (ref 1.005–1.030)
pH: 5 (ref 5.0–8.0)

## 2024-06-05 LAB — CBC WITH DIFFERENTIAL/PLATELET
Abs Immature Granulocytes: 0.04 K/uL (ref 0.00–0.07)
Basophils Absolute: 0 K/uL (ref 0.0–0.1)
Basophils Relative: 0 %
Eosinophils Absolute: 0.2 K/uL (ref 0.0–0.5)
Eosinophils Relative: 2 %
HCT: 41 % (ref 36.0–46.0)
Hemoglobin: 13.6 g/dL (ref 12.0–15.0)
Immature Granulocytes: 0 %
Lymphocytes Relative: 7 %
Lymphs Abs: 0.6 K/uL — ABNORMAL LOW (ref 0.7–4.0)
MCH: 32.3 pg (ref 26.0–34.0)
MCHC: 33.2 g/dL (ref 30.0–36.0)
MCV: 97.4 fL (ref 80.0–100.0)
Monocytes Absolute: 0.4 K/uL (ref 0.1–1.0)
Monocytes Relative: 5 %
Neutro Abs: 7.7 K/uL (ref 1.7–7.7)
Neutrophils Relative %: 86 %
Platelets: 168 K/uL (ref 150–400)
RBC: 4.21 MIL/uL (ref 3.87–5.11)
RDW: 13.2 % (ref 11.5–15.5)
WBC: 9 K/uL (ref 4.0–10.5)
nRBC: 0 % (ref 0.0–0.2)

## 2024-06-05 LAB — BASIC METABOLIC PANEL WITH GFR
Anion gap: 13 (ref 5–15)
BUN: 35 mg/dL — ABNORMAL HIGH (ref 8–23)
CO2: 20 mmol/L — ABNORMAL LOW (ref 22–32)
Calcium: 9.2 mg/dL (ref 8.9–10.3)
Chloride: 105 mmol/L (ref 98–111)
Creatinine, Ser: 2.03 mg/dL — ABNORMAL HIGH (ref 0.44–1.00)
GFR, Estimated: 26 mL/min — ABNORMAL LOW (ref >60)
Glucose, Bld: 118 mg/dL — ABNORMAL HIGH (ref 70–99)
Potassium: 4.2 mmol/L (ref 3.5–5.1)
Sodium: 138 mmol/L (ref 135–145)

## 2024-06-05 MED ORDER — SODIUM CHLORIDE 0.9 % IV SOLN
1.0000 g | Freq: Once | INTRAVENOUS | Status: AC
  Administered 2024-06-05: 1 g via INTRAVENOUS
  Filled 2024-06-05: qty 10

## 2024-06-05 MED ORDER — SODIUM CHLORIDE 0.9 % IV BOLUS
1000.0000 mL | Freq: Once | INTRAVENOUS | Status: AC
  Administered 2024-06-05: 1000 mL via INTRAVENOUS

## 2024-06-05 NOTE — ED Provider Notes (Signed)
 [facility] Provider Note   CSN: [id number] Arrival date & time: 06/05/24  1821     Patient presents with: Urinary Tract Infection   [name] is a 73 y.o. female.   Patient complains of discomfort with urination.  Patient states she was seen yesterday at urgent care and started on cephalexin  for a UTI.  Patient reports she had a temperature at home today.  Patient reports she has taken 2 doses of antibiotics.  Patient reports discomfort with urination she complains of some nausea.  Patient denies any abdominal pain she is not having any back pain.  Patient denies any vomiting or diarrhea.  Patient reports that her doctor has started her on Farxiga and she thinks this causes her to get more frequent urinary tract infections.  The history is provided by the patient. No language interpreter was used.  Urinary Tract Infection Pain quality:  Aching Pain severity:  Moderate Onset quality:  Gradual Timing:  Constant Progression:  Worsening Relieved by:  Nothing Worsened by:  Nothing Urinary symptoms: frequent urination   Associated symptoms: no abdominal pain and no flank pain   Risk factors: recurrent urinary tract infections and renal disease        Prior to Admission medications   Medication Sig Start Date End Date Taking? Authorizing Provider  albuterol  (VENTOLIN  HFA) 108 (90 Base) MCG/ACT inhaler Inhale 2 puffs into the lungs every 6 (six) hours as needed. 09/28/23   Leath-Warren, Etta PARAS, NP  albuterol  (VENTOLIN  HFA) 108 (90 Base) MCG/ACT inhaler Inhale 2 puffs into the lungs every 4 (four) hours as needed. 10/04/23   Stuart Vernell Norris, PA-C  amlodipine-benazepril (LOTREL) 2.5-10 MG capsule Take 1 capsule by mouth daily.    [provider]  Calcium Carb-Cholecalciferol (CALCIUM 500+D PO) Take 1 tablet by mouth daily.    [provider]  cephALEXin  (KEFLEX ) 500 MG capsule Take 1 capsule (500 mg total) by  mouth 2 (two) times daily. 06/04/24   Stuart Vernell Norris, PA-C  cholecalciferol (VITAMIN D) 1000 UNITS tablet Take 1,000 Units by mouth daily.    [provider]  CINNAMON PO Take 1,000 mg by mouth daily.     [provider]  dapagliflozin propanediol (FARXIGA) 10 MG TABS tablet Take by mouth daily.    [provider]  metoprolol succinate (TOPROL-XL) 25 MG 24 hr tablet Take 25 mg by mouth daily.    [provider]  Multiple Vitamin (MULTI VITAMIN DAILY PO) Take 1 each by mouth daily.    [provider]  Omega-3 Fatty Acids (FISH OIL) 1000 MG CAPS Take 1 each by mouth daily.     [provider]  simvastatin (ZOCOR) 20 MG tablet Take 20 mg by mouth every evening.     [provider]  vitamin C (ASCORBIC ACID) 500 MG tablet Take 500 mg by mouth daily.    [provider]    Allergies: Compazine    Review of Systems  Gastrointestinal:  Negative for abdominal pain.  Genitourinary:  Negative for flank pain.  All other systems reviewed and are negative.   Updated Vital Signs BP 135/83   Pulse (!) 56   Temp 98.4 F (36.9 C) (Oral)   Resp 16   Ht 5' 5 (1.651 m)   Wt 81.6 kg   SpO2 97%   BMI 29.95 kg/m   Physical Exam Vitals and nursing note reviewed.  Constitutional:      Appearance:  She is well-developed.  HENT:     Head: Normocephalic.     Mouth/Throat:     Mouth: Mucous membranes are moist.  Cardiovascular:     Rate and Rhythm: Normal rate and regular rhythm.  Pulmonary:     Effort: Pulmonary effort is normal.  Abdominal:     General: There is no distension.  Musculoskeletal:        General: Normal range of motion.     Cervical back: Normal range of motion.  Skin:    General: Skin is warm.  Neurological:     General: No focal deficit present.     Mental Status: She is alert and oriented to person, place, and time.  Psychiatric:        Mood and Affect: Mood normal.     (all labs ordered are  listed, but only abnormal results are displayed) Labs Reviewed  CBC WITH DIFFERENTIAL/PLATELET - Abnormal; Notable for the following components:      Result Value   Lymphs Abs 0.6 (*)    All other components within normal limits  BASIC METABOLIC PANEL WITH GFR - Abnormal; Notable for the following components:   CO2 20 (*)    Glucose, Bld 118 (*)    BUN 35 (*)    Creatinine, Ser 2.03 (*)    GFR, Estimated 26 (*)    All other components within normal limits  URINALYSIS, ROUTINE W REFLEX MICROSCOPIC - Abnormal; Notable for the following components:   APPearance HAZY (*)    Glucose, UA >=500 (*)    Protein, ur 100 (*)    Leukocytes,Ua TRACE (*)    Bacteria, UA RARE (*)    All other components within normal limits  URINE CULTURE    EKG: None  Radiology: No results found.   Procedures   Medications Ordered in the ED  sodium chloride  0.9 % bolus 1,000 mL (0 mLs Intravenous Stopped 06/05/24 2253)  cefTRIAXone  (ROCEPHIN ) 1 g in sodium chloride  0.9 % 100 mL IVPB (0 g Intravenous Stopped 06/05/24 2233)                                    Medical Decision Making Patient complains of UTI symptoms.  She reports she had a fever earlier today.  Amount and/or Complexity of Data Reviewed Independent Historian:     Details: Patient is here with her sister who is supportive Labs: ordered. Decision-making details documented in ED Course.    Details: Labs ordered reviewed and interpreted creatinine is 2.03, BUN is 35 glucose is 118.  Patient's GFR is 26.  UA shows 21-50 white blood cells  Risk Risk Details: Patient's renal functions are at baseline.  She is followed by nephrology.  Patient given Rocephin  1 g IV she is given a 1 L of IV fluids.  Patient is requesting to go home.  I advised her to continue oral antibiotics that she is advised to follow-up with her primary care physician for recheck in the next 2 to 3 days.  She agrees to return to the emergency department if symptoms become  worse or change        Final diagnoses:  Urinary tract infection without hematuria, site unspecified    ED Discharge Orders     None       An After Visit Summary was printed and given to the patient.    Flint Sonny POUR, PA-C 06/05/24 2336  Towana Ozell BROCKS, MD 06/06/24 [phone]

## 2024-06-05 NOTE — ED Notes (Addendum)
Pt sitting up in room in NAD

## 2024-06-05 NOTE — ED Notes (Addendum)
 Pt ambulated to the bathroom without difficulty to try to provide urine sample.

## 2024-06-05 NOTE — Discharge Instructions (Addendum)
 Return if symptoms worsen or change.  Continue current medications for uti

## 2024-06-05 NOTE — ED Triage Notes (Signed)
 Pt dx'd with UTI yesterday at [facility].  Pt states she is worse. + fever 101.6 highest per pt. Loss of appetite. Denies any N/V/d

## 2024-06-06 ENCOUNTER — Ambulatory Visit (HOSPITAL_COMMUNITY): Payer: Self-pay

## 2024-06-06 LAB — URINE CULTURE

## 2024-06-07 LAB — URINE CULTURE: Culture: NO GROWTH

## 2024-06-08 DIAGNOSIS — N390 Urinary tract infection, site not specified: Secondary | ICD-10-CM | POA: Diagnosis not present

## 2024-06-08 DIAGNOSIS — E1129 Type 2 diabetes mellitus with other diabetic kidney complication: Secondary | ICD-10-CM | POA: Diagnosis not present

## 2024-06-26 ENCOUNTER — Other Ambulatory Visit (HOSPITAL_COMMUNITY): Payer: Self-pay | Admitting: Family Medicine

## 2024-06-26 DIAGNOSIS — Z1231 Encounter for screening mammogram for malignant neoplasm of breast: Secondary | ICD-10-CM

## 2024-07-07 ENCOUNTER — Ambulatory Visit (HOSPITAL_COMMUNITY)
Admission: RE | Admit: 2024-07-07 | Discharge: 2024-07-07 | Disposition: A | Discharge status: Home / Self Care | Source: Ambulatory Visit | Attending: Family Medicine | Admitting: Family Medicine

## 2024-07-07 DIAGNOSIS — Z1231 Encounter for screening mammogram for malignant neoplasm of breast: Secondary | ICD-10-CM | POA: Insufficient documentation

## 2024-11-07 ENCOUNTER — Ambulatory Visit: Admitting: Physician Assistant
